# Patient Record
Sex: Female | Born: 1985 | Race: Black or African American | Hispanic: No | Marital: Single | Smoking: Former smoker
Health system: Southern US, Community
[De-identification: ages and names within clinical notes are randomized; demographics above are authoritative.]

## PROBLEM LIST (undated history)

## (undated) DIAGNOSIS — K219 Gastro-esophageal reflux disease without esophagitis: Secondary | ICD-10-CM

## (undated) DIAGNOSIS — N3000 Acute cystitis without hematuria: Secondary | ICD-10-CM

## (undated) DIAGNOSIS — J301 Allergic rhinitis due to pollen: Secondary | ICD-10-CM

## (undated) DIAGNOSIS — I10 Essential (primary) hypertension: Secondary | ICD-10-CM

## (undated) DIAGNOSIS — L0291 Cutaneous abscess, unspecified: Secondary | ICD-10-CM

## (undated) DIAGNOSIS — K829 Disease of gallbladder, unspecified: Secondary | ICD-10-CM

## (undated) DIAGNOSIS — E119 Type 2 diabetes mellitus without complications: Secondary | ICD-10-CM

---
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
## 2009-05-29 ENCOUNTER — Emergency Department (HOSPITAL_COMMUNITY): Admission: EM | Admit: 2009-05-29 | Discharge: 2009-05-29 | Payer: Self-pay | Admitting: Emergency Medicine

## 2009-11-02 ENCOUNTER — Inpatient Hospital Stay (HOSPITAL_COMMUNITY): Admission: AD | Admit: 2009-11-02 | Discharge: 2009-11-02 | Payer: Self-pay | Admitting: Family Medicine

## 2010-03-28 ENCOUNTER — Emergency Department (HOSPITAL_COMMUNITY): Admission: EM | Admit: 2010-03-28 | Discharge: 2010-03-28 | Payer: Self-pay | Admitting: Family Medicine

## 2010-11-29 ENCOUNTER — Emergency Department (HOSPITAL_COMMUNITY): Admission: EM | Admit: 2010-11-29 | Discharge: 2010-06-28 | Payer: Self-pay | Admitting: Emergency Medicine

## 2011-03-13 LAB — POCT RAPID STREP A (OFFICE): Streptococcus, Group A Screen (Direct): NEGATIVE

## 2011-03-27 LAB — URINE MICROSCOPIC-ADD ON

## 2011-03-27 LAB — URINALYSIS, ROUTINE W REFLEX MICROSCOPIC
Glucose, UA: NEGATIVE mg/dL
Nitrite: NEGATIVE
Specific Gravity, Urine: 1.015 (ref 1.005–1.030)
pH: 6 (ref 5.0–8.0)

## 2011-03-27 LAB — WET PREP, GENITAL

## 2011-03-27 LAB — POCT PREGNANCY, URINE: Preg Test, Ur: NEGATIVE

## 2011-08-21 ENCOUNTER — Emergency Department (HOSPITAL_COMMUNITY)
Admission: EM | Admit: 2011-08-21 | Discharge: 2011-08-22 | Disposition: A | Discharge status: Home / Self Care | Payer: Self-pay | Attending: Emergency Medicine | Admitting: Emergency Medicine

## 2011-08-21 DIAGNOSIS — R229 Localized swelling, mass and lump, unspecified: Secondary | ICD-10-CM | POA: Insufficient documentation

## 2011-08-21 DIAGNOSIS — R509 Fever, unspecified: Secondary | ICD-10-CM | POA: Insufficient documentation

## 2011-08-21 DIAGNOSIS — M79609 Pain in unspecified limb: Secondary | ICD-10-CM | POA: Insufficient documentation

## 2011-09-23 ENCOUNTER — Ambulatory Visit (INDEPENDENT_AMBULATORY_CARE_PROVIDER_SITE_OTHER): Payer: Self-pay

## 2011-09-23 ENCOUNTER — Inpatient Hospital Stay (INDEPENDENT_AMBULATORY_CARE_PROVIDER_SITE_OTHER)
Admission: RE | Admit: 2011-09-23 | Discharge: 2011-09-23 | Disposition: A | Discharge status: Home / Self Care | Payer: Self-pay | Source: Ambulatory Visit | Attending: Emergency Medicine | Admitting: Emergency Medicine

## 2011-09-23 DIAGNOSIS — K5289 Other specified noninfective gastroenteritis and colitis: Secondary | ICD-10-CM

## 2011-09-23 LAB — DIFFERENTIAL
Basophils Absolute: 0 10*3/uL (ref 0.0–0.1)
Eosinophils Absolute: 0.2 10*3/uL (ref 0.0–0.7)
Eosinophils Relative: 2 % (ref 0–5)
Lymphocytes Relative: 21 % (ref 12–46)
Lymphs Abs: 2.1 10*3/uL (ref 0.7–4.0)
Monocytes Absolute: 0.8 10*3/uL (ref 0.1–1.0)

## 2011-09-23 LAB — CBC
HCT: 39.4 % (ref 36.0–46.0)
MCHC: 33.5 g/dL (ref 30.0–36.0)
MCV: 90.2 fL (ref 78.0–100.0)
Platelets: 360 10*3/uL (ref 150–400)
RDW: 13.5 % (ref 11.5–15.5)
WBC: 10 10*3/uL (ref 4.0–10.5)

## 2011-09-23 LAB — POCT URINALYSIS DIP (DEVICE)
Ketones, ur: NEGATIVE mg/dL
Protein, ur: 30 mg/dL — AB
Urobilinogen, UA: 0.2 mg/dL (ref 0.0–1.0)
pH: 6.5 (ref 5.0–8.0)

## 2011-09-23 LAB — POCT PREGNANCY, URINE: Preg Test, Ur: NEGATIVE

## 2011-09-24 LAB — URINE CULTURE

## 2012-04-10 ENCOUNTER — Emergency Department (HOSPITAL_COMMUNITY): Payer: Self-pay

## 2012-04-10 ENCOUNTER — Encounter (HOSPITAL_COMMUNITY): Payer: Self-pay | Admitting: *Deleted

## 2012-04-10 ENCOUNTER — Emergency Department (HOSPITAL_COMMUNITY)
Admission: EM | Admit: 2012-04-10 | Discharge: 2012-04-10 | Disposition: A | Discharge status: Home / Self Care | Payer: Self-pay | Attending: Emergency Medicine | Admitting: Emergency Medicine

## 2012-04-10 DIAGNOSIS — R1013 Epigastric pain: Secondary | ICD-10-CM | POA: Insufficient documentation

## 2012-04-10 DIAGNOSIS — F17200 Nicotine dependence, unspecified, uncomplicated: Secondary | ICD-10-CM | POA: Insufficient documentation

## 2012-04-10 DIAGNOSIS — K8020 Calculus of gallbladder without cholecystitis without obstruction: Secondary | ICD-10-CM | POA: Insufficient documentation

## 2012-04-10 DIAGNOSIS — K8050 Calculus of bile duct without cholangitis or cholecystitis without obstruction: Secondary | ICD-10-CM

## 2012-04-10 DIAGNOSIS — R112 Nausea with vomiting, unspecified: Secondary | ICD-10-CM | POA: Insufficient documentation

## 2012-04-10 LAB — COMPREHENSIVE METABOLIC PANEL
AST: 48 U/L — ABNORMAL HIGH (ref 0–37)
BUN: 10 mg/dL (ref 6–23)
CO2: 24 mEq/L (ref 19–32)
Chloride: 103 mEq/L (ref 96–112)
Creatinine, Ser: 0.72 mg/dL (ref 0.50–1.10)
GFR calc Af Amer: >90 mL/min (ref >90)
GFR calc non Af Amer: >90 mL/min (ref >90)
Glucose, Bld: 104 mg/dL — ABNORMAL HIGH (ref 70–99)
Total Bilirubin: 0.3 mg/dL (ref 0.3–1.2)

## 2012-04-10 LAB — URINALYSIS, ROUTINE W REFLEX MICROSCOPIC
Glucose, UA: NEGATIVE mg/dL
Ketones, ur: NEGATIVE mg/dL
Protein, ur: NEGATIVE mg/dL
Urobilinogen, UA: 1 mg/dL (ref 0.0–1.0)

## 2012-04-10 LAB — DIFFERENTIAL
Basophils Absolute: 0 10*3/uL (ref 0.0–0.1)
Eosinophils Relative: 0 % (ref 0–5)
Lymphocytes Relative: 12 % (ref 12–46)
Lymphs Abs: 2.1 10*3/uL (ref 0.7–4.0)
Monocytes Absolute: 1.5 10*3/uL — ABNORMAL HIGH (ref 0.1–1.0)
Monocytes Relative: 8 % (ref 3–12)
Neutro Abs: 14.7 10*3/uL — ABNORMAL HIGH (ref 1.7–7.7)

## 2012-04-10 LAB — CBC
HCT: 40 % (ref 36.0–46.0)
Hemoglobin: 13.7 g/dL (ref 12.0–15.0)
MCV: 87.1 fL (ref 78.0–100.0)
RBC: 4.59 MIL/uL (ref 3.87–5.11)
WBC: 18.4 10*3/uL — ABNORMAL HIGH (ref 4.0–10.5)

## 2012-04-10 LAB — URINE MICROSCOPIC-ADD ON

## 2012-04-10 MED ORDER — ONDANSETRON HCL 4 MG PO TABS: 4.0000 mg | ORAL_TABLET | Freq: Three times a day (TID) | ORAL | Status: AC | PRN

## 2012-04-10 MED ORDER — HYDROCODONE-ACETAMINOPHEN 5-325 MG PO TABS: 1.0000 | ORAL_TABLET | ORAL | Status: AC | PRN

## 2012-04-10 MED ORDER — ONDANSETRON HCL 4 MG/2ML IJ SOLN
4.0000 mg | Freq: Once | INTRAMUSCULAR | Status: AC
  Administered 2012-04-10: 4 mg via INTRAVENOUS
  Filled 2012-04-10: qty 2

## 2012-04-10 MED ORDER — SODIUM CHLORIDE 0.9 % IV BOLUS (SEPSIS)
1000.0000 mL | Freq: Once | INTRAVENOUS | Status: AC
  Administered 2012-04-10: 1000 mL via INTRAVENOUS

## 2012-04-10 NOTE — ED Provider Notes (Signed)
History     CSN: [id number]  Arrival date & time 04/10/12  0020   First MD Initiated Contact with Patient 04/10/12 0232      Chief Complaint  Patient presents with  . Abdominal Pain    (Consider location/radiation/quality/duration/timing/severity/associated sxs/prior treatment) HPI Comments: Patient with several month history of episodic epigastric abdominal pain that worsens after meals.  She states that initially the pain was infrequent in nature but over the past week it has been after most every meal with nausea and vomiting for the past 2 days - she states tonight after eating dinner she reports severe pain with vomiting of NBNB vomitus.  She denies raidation of the pain, states that the pain is sharp and stabbing, has eased once she vomited.  She denies fever, chills, constipation, diarrhea, vaginal discharge or bleeding, dysuria, hematuria, previous abdominal surgeries.    Patient is a 26 y.o. female presenting with abdominal pain. The history is provided by the patient. No language interpreter was used.  Abdominal Pain The primary symptoms of the illness include abdominal pain, nausea and vomiting. The primary symptoms of the illness do not include fever, fatigue, shortness of breath, diarrhea, hematemesis, hematochezia, dysuria, vaginal discharge or vaginal bleeding. The current episode started more than 2 days ago. The onset of the illness was gradual. The problem has been gradually worsening.  The illness is associated with eating. The patient states that she believes she is currently not pregnant. The patient has not had a change in bowel habit. Symptoms associated with the illness do not include chills, anorexia, diaphoresis, heartburn, constipation, urgency, hematuria, frequency or back pain.    History reviewed. No pertinent past medical history.  History reviewed. No pertinent past surgical history.  History reviewed. No pertinent family history.  History  Substance Use  Topics  . Smoking status: Current Everyday Smoker -- 0.5 packs/day    Types: Cigarettes  . Smokeless tobacco: Not on file  . Alcohol Use: No    OB History    Grav Para Term Preterm Abortions TAB SAB Ect Mult Living                  Review of Systems  Constitutional: Negative for fever, chills, diaphoresis and fatigue.  Respiratory: Negative for shortness of breath.   Gastrointestinal: Positive for nausea, vomiting and abdominal pain. Negative for heartburn, diarrhea, constipation, hematochezia, anorexia and hematemesis.  Genitourinary: Negative for dysuria, urgency, frequency, hematuria, vaginal bleeding and vaginal discharge.  Musculoskeletal: Negative for back pain.  All other systems reviewed and are negative.    Allergies  Penicillins  Home Medications  No current outpatient prescriptions on file.  BP 118/70  Pulse 87  Temp(Src) 98.1 F (36.7 C) (Oral)  Resp 13  SpO2 97%  Physical Exam  Nursing note and vitals reviewed. Constitutional: She is oriented to person, place, and time. She appears well-developed and well-nourished. No distress.  HENT:  Head: Normocephalic and atraumatic.  Right Ear: External ear normal.  Left Ear: External ear normal.  Nose: Nose normal.  Mouth/Throat: Oropharynx is clear and moist. No oropharyngeal exudate.  Eyes: Conjunctivae are normal. Pupils are equal, round, and reactive to light. No scleral icterus.  Neck: Normal range of motion. Neck supple.  Cardiovascular: Normal rate, regular rhythm and normal heart sounds.  Exam reveals no gallop and no friction rub.   No murmur heard. Pulmonary/Chest: Effort normal and breath sounds normal. No respiratory distress. She has no wheezes. She has no rales. She exhibits no  tenderness.  Abdominal: Soft. Bowel sounds are normal. She exhibits no distension and no mass. There is tenderness. There is guarding. There is no rebound.    Musculoskeletal: Normal range of motion. She exhibits no edema  and no tenderness.  Lymphadenopathy:    She has no cervical adenopathy.  Neurological: She is alert and oriented to person, place, and time. No cranial nerve deficit.  Skin: Skin is warm and dry. No rash noted. No erythema. No pallor.  Psychiatric: She has a normal mood and affect. Her behavior is normal. Judgment and thought content normal.    ED Course  Procedures (including critical care time)  Labs Reviewed  CBC - Abnormal; Notable for the following:    WBC 18.4 (*)    Platelets 403 (*)    All other components within normal limits  DIFFERENTIAL - Abnormal; Notable for the following:    Neutrophils Relative 80 (*)    Neutro Abs 14.7 (*)    Monocytes Absolute 1.5 (*)    All other components within normal limits  COMPREHENSIVE METABOLIC PANEL - Abnormal; Notable for the following:    Glucose, Bld 104 (*)    AST 48 (*)    ALT 38 (*)    All other components within normal limits  URINALYSIS, ROUTINE W REFLEX MICROSCOPIC - Abnormal; Notable for the following:    APPearance CLOUDY (*)    Leukocytes, UA LARGE (*)    All other components within normal limits  URINE MICROSCOPIC-ADD ON - Abnormal; Notable for the following:    Squamous Epithelial / LPF FEW (*)    Bacteria, UA MANY (*)    All other components within normal limits  LIPASE, BLOOD  PREGNANCY, URINE   No results found. Results for orders placed during the hospital encounter of 04/10/12  CBC      Component Value Range   WBC 18.4 (*) 4.0 - 10.5 (K/uL)   RBC 4.59  3.87 - 5.11 (MIL/uL)   Hemoglobin 13.7  12.0 - 15.0 (g/dL)   HCT 16.1  09.6 - 04.5 (%)   MCV 87.1  78.0 - 100.0 (fL)   MCH 29.8  26.0 - 34.0 (pg)   MCHC 34.3  30.0 - 36.0 (g/dL)   RDW 40.9  81.1 - 91.4 (%)   Platelets 403 (*) 150 - 400 (K/uL)  DIFFERENTIAL      Component Value Range   Neutrophils Relative 80 (*) 43 - 77 (%)   Neutro Abs 14.7 (*) 1.7 - 7.7 (K/uL)   Lymphocytes Relative 12  12 - 46 (%)   Lymphs Abs 2.1  0.7 - 4.0 (K/uL)   Monocytes  Relative 8  3 - 12 (%)   Monocytes Absolute 1.5 (*) 0.1 - 1.0 (K/uL)   Eosinophils Relative 0  0 - 5 (%)   Eosinophils Absolute 0.1  0.0 - 0.7 (K/uL)   Basophils Relative 0  0 - 1 (%)   Basophils Absolute 0.0  0.0 - 0.1 (K/uL)  COMPREHENSIVE METABOLIC PANEL      Component Value Range   Sodium 138  135 - 145 (mEq/L)   Potassium 4.6  3.5 - 5.1 (mEq/L)   Chloride 103  96 - 112 (mEq/L)   CO2 24  19 - 32 (mEq/L)   Glucose, Bld 104 (*) 70 - 99 (mg/dL)   BUN 10  6 - 23 (mg/dL)   Creatinine, Ser 7.82  0.50 - 1.10 (mg/dL)   Calcium 95.6  8.4 - 10.5 (mg/dL)   Total Protein  7.9  6.0 - 8.3 (g/dL)   Albumin 4.4  3.5 - 5.2 (g/dL)   AST 48 (*) 0 - 37 (U/L)   ALT 38 (*) 0 - 35 (U/L)   Alkaline Phosphatase 90  39 - 117 (U/L)   Total Bilirubin 0.3  0.3 - 1.2 (mg/dL)   GFR calc non Af Amer >90  >90 (mL/min)   GFR calc Af Amer >90  >90 (mL/min)  LIPASE, BLOOD      Component Value Range   Lipase 31  11 - 59 (U/L)  URINALYSIS, ROUTINE W REFLEX MICROSCOPIC      Component Value Range   Color, Urine YELLOW  YELLOW    APPearance CLOUDY (*) CLEAR    Specific Gravity, Urine 1.024  1.005 - 1.030    pH 6.5  5.0 - 8.0    Glucose, UA NEGATIVE  NEGATIVE (mg/dL)   Hgb urine dipstick NEGATIVE  NEGATIVE    Bilirubin Urine NEGATIVE  NEGATIVE    Ketones, ur NEGATIVE  NEGATIVE (mg/dL)   Protein, ur NEGATIVE  NEGATIVE (mg/dL)   Urobilinogen, UA 1.0  0.0 - 1.0 (mg/dL)   Nitrite NEGATIVE  NEGATIVE    Leukocytes, UA LARGE (*) NEGATIVE   PREGNANCY, URINE      Component Value Range   Preg Test, Ur NEGATIVE  NEGATIVE   URINE MICROSCOPIC-ADD ON      Component Value Range   Squamous Epithelial / LPF FEW (*) RARE    WBC, UA 7-10  <3 (WBC/hpf)   Bacteria, UA MANY (*) RARE    US Abdomen Complete  04/10/2012  *RADIOLOGY REPORT*  Clinical Data:  Abdominal pain.  ABDOMINAL ULTRASOUND COMPLETE  Comparison:  Abdominal radiograph performed 09/23/2011  Findings:  Gallbladder:  Scattered mobile stones are seen layering  dependently within the gallbladder, measuring up to 0.7 cm in size.  No gallbladder wall thickening or pericholecystic fluid is seen. No ultrasonographic Murphy's sign is elicited.  Common Bile Duct:  6.6 mm in diameter; borderline prominent, without evidence of intrahepatic biliary ductal dilatation or other evidence for obstruction.  Liver:  Diffusely increased hepatic echogenicity and mildly coarsened echotexture likely reflect fatty infiltration; no focal lesions identified.  Limited Doppler evaluation demonstrates normal blood flow within the liver.  IVC:  Unremarkable in appearance.  Pancreas:  Although the pancreas is difficult to visualize in its entirety due to overlying bowel gas, no focal pancreatic abnormality is identified.  Spleen:  9.1 cm in length; within normal limits in size and echotexture.  Right kidney:  13.0 cm in length; normal in size, configuration and parenchymal echogenicity.  No evidence of mass or hydronephrosis.  Left kidney:  13.0 cm in length; normal in size, configuration and parenchymal echogenicity.  No evidence of mass or hydronephrosis.  Abdominal Aorta:  Normal in caliber; no aneurysm identified.  IMPRESSION:  1.  Borderline prominence of the common hepatic duct, measuring up to 6.6 mm in diameter, without definite evidence for obstruction. No intrahepatic biliary ductal dilatation seen. 2.  Cholelithiasis; gallbladder otherwise unremarkable in appearance. 3.  Diffuse fatty infiltration within the liver.  Original Report Authenticated By: Tonia Ghent, M.D.      Biliary Colic Cholelithiasis    MDM  Patient with symptoms consistent with biliary colic, she does have elevation in WBC, with mild elevation in transaminases without elevation in bili.  She clinically appears very comfortable and after discussion with Dr. Janee Morn with general surgery (after the patient was seen and examined by Dr. Ethelda Chick) we feel  that because the patient is comfortable she can follow up  with them on an outpatient basis.  Patient is aware of conditions and symptoms for which she should return and will call [facility] today for the next available appointment.        Tracy Weaver [place], [place] 04/10/12 [phone]

## 2012-04-10 NOTE — ED Provider Notes (Signed)
Medical screening examination/treatment/procedure(s) were conducted as a shared visit with non-physician practitioner(s) and myself.  I personally evaluated the patient during the encounter  Doug Sou, MD 04/10/12 [phone]

## 2012-04-10 NOTE — Discharge Instructions (Signed)
Biliary Colic  Biliary colic is a steady or irregular pain in the upper abdomen. It is usually under the right side of the rib cage. It happens when gallstones interfere with the normal flow of bile from the gallbladder. Bile is a liquid that helps to digest fats. Bile is made in the liver and stored in the gallbladder. When you eat a meal, bile passes from the gallbladder through the cystic duct and the common bile duct into the small intestine. There, it mixes with partially digested food. If a gallstone blocks either of these ducts, the normal flow of bile is blocked. The muscle cells in the bile duct contract forcefully to try to move the stone. This causes the pain of biliary colic.  SYMPTOMS   A person with biliary colic usually complains of pain in the upper abdomen. This pain can be:   In the center of the upper abdomen just below the breastbone.   In the upper-right part of the abdomen, near the gallbladder and liver.   Spread back toward the right shoulder blade.   Nausea and vomiting.   The pain usually occurs after eating.   Biliary colic is usually triggered by the digestive system's demand for bile. The demand for bile is high after fatty meals. Symptoms can also occur when a person who has been fasting suddenly eats a very large meal. Most episodes of biliary colic pass after 1 to 5 hours. After the most intense pain passes, your abdomen may continue to ache mildly for about 24 hours.  DIAGNOSIS  After you describe your symptoms, your caregiver will perform a physical exam. He or she will pay attention to the upper right portion of your belly (abdomen). This is the area of your liver and gallbladder. An ultrasound will help your caregiver look for gallstones. Specialized scans of the gallbladder may also be done. Blood tests may be done, especially if you have fever or if your pain persists. PREVENTION  Biliary colic can be prevented by controlling the risk factors for gallstones.  Some of these risk factors, such as heredity, increasing age, and pregnancy are a normal part of life. Obesity and a high-fat diet are risk factors you can change through a healthy lifestyle. Women going through menopause who take hormone replacement therapy (estrogen) are also more likely to develop biliary colic. TREATMENT   Pain medication may be prescribed.   You may be encouraged to eat a fat-free diet.   If the first episode of biliary colic is severe, or episodes of colic keep retuning, surgery to remove the gallbladder (cholecystectomy) is usually recommended. This procedure can be done through small incisions using an instrument called a laparoscope. The procedure often requires a brief stay in the hospital. Some people can leave the hospital the same day. It is the most widely used treatment in people troubled by painful gallstones. It is effective and safe, with no complications in more than 90% of cases.   If surgery cannot be done, medication that dissolves gallstones may be used. This medication is expensive and can take months or years to work. Only small stones will dissolve.   Rarely, medication to dissolve gallstones is combined with a procedure called shock-wave lithotripsy. This procedure uses carefully aimed shock waves to break up gallstones. In many people treated with this procedure, gallstones form again within a few years.  PROGNOSIS  If gallstones block your cystic duct or common bile duct, you are at risk for repeated episodes of   biliary colic. There is also a 25% chance that you will develop a gallbladder infection(acute cholecystitis), or some other complication of gallstones within 10 to 20 years. If you have surgery, schedule it at a time that is convenient for you and at a time when you are not sick. HOME CARE INSTRUCTIONS   Drink plenty of clear fluids.   Avoid fatty, greasy or fried foods, or any foods that make your pain worse.   Take medications as directed.    SEEK MEDICAL CARE IF:   You develop a fever over 100.5 F (38.1 C).   Your pain gets worse over time.   You develop nausea that prevents you from eating and drinking.   You develop vomiting.  SEEK IMMEDIATE MEDICAL CARE IF:   You have continuous or severe belly (abdominal) pain which is not relieved with medications.   You develop nausea and vomiting which is not relieved with medications.   You have symptoms of biliary colic and you suddenly develop a fever and shaking chills. This may signal cholecystitis. Call your caregiver immediately.   You develop a yellow color to your skin or the white part of your eyes (jaundice).  Document Released: 05/12/2006 Document Revised: 11/28/2011 Document Reviewed: 07/21/2008 [facility] Patient Information 2012 [place], [place].Gallstones Gallstones are a form of gallbladder disease. The gallbladder is a small organ that helps you digest food.  HOME CARE  Only take medicine as told by your doctor.   Eat a low-fat diet.   Follow up as told.  GET HELP RIGHT AWAY IF:   Your pain gets worse.   You develop yellow skin or eyes (jaundice).   The pain moves to another part of your belly (abdomen) or back.   You have a temperature by mouth above 102 F (38.9 C), not controlled by medicine.   You feel sick to your stomach (nauseous) and throw up (vomit).  MAKE SURE YOU:   Understand these instructions.   Will watch your condition.   Will get help right away if you are not doing well or get worse.  Document Released: 05/27/2008 Document Revised: 11/28/2011 Document Reviewed: 11/14/2009 [facility] Patient Information 2012 [place], [place].Gallbladder Disease Gallbladder disease (cholecystitis) is an inflammation of your gallbladder. It is usually caused by a build-up of stones (gallstones) or sludge (cholelithiasis) in your gallbladder. The gallbladder is not an essential organ. It is located slightly to the right of center in the belly  (abdomen), behind the liver. It stores bile made in the liver. Bile aids in digestion of fats. Gallbladder disease may result in nausea (feeling sick to your stomach), abdominal pain, and jaundice. In severe cases, emergency surgery may be required. The most common type of gallbladder disease is gallstones. They begin as small crystals and slowly grow into stones. Gallstone pain occurs when the bile duct has spasms. The spasms are caused by the stone passing out of the duct. The stone is trying to pass at the same time bile is passing into the small bowel for digestion. The pain usually begins suddenly. It may persist from several minutes to several hours. Infection can occur. Infection can add to discomfort and severity of an acute attack. The pain may be made worse by breathing deeply or by being jarred. There may be fever and tenderness to the touch. In some cases, when gallstones do not move into the bile duct, people have no pain or symptoms. These are called "silent" gallstones. Women are three times more likely to develop gallstones  than men. Women who have had several pregnancies are more likely to have gallbladder disease. Physicians sometimes advise removing diseased gallbladders before future pregnancies. Other factors that increase the risk of gallbladder disease are obesity, diets heavy in fried foods and dairy products, increasing age, prolonged use of medications containing female hormones, and heredity. HOME CARE INSTRUCTIONS   If your physician prescribed an antibiotic, take as directed.   Only take over-the-counter or prescription medicines for pain, discomfort, or fever as directed by your caregiver.   Follow a low fat diet until seen again. (Fat causes the gallbladder to contract.)   Follow-up as instructed. Attacks are almost always recurrent and surgery is usually required for permanent treatment.  SEEK IMMEDIATE MEDICAL CARE IF:   Pain is increasing and not controlled by  medications.   The pain moves to another part of your abdomen or to your back. (Right sided pain can be appendicitis and left sided pain in adults can be diverticulitis).   You have a fever.   You develop nausea and vomiting.  Document Released: 12/09/2005 Document Revised: 11/28/2011 Document Reviewed: 10/25/2011 [facility] Patient Information 2012 [place], [place].

## 2012-04-10 NOTE — ED Notes (Signed)
Patient is having mid abdominal pain that started after she ate dinner.  Patient has progressively gotten worse.  Intermittent pain that is sharp in nature.

## 2012-04-10 NOTE — ED Notes (Signed)
Pt complains of RUQ pain that is described as stabbing and constant. Pt states she has also had N/V with the pain.

## 2012-04-10 NOTE — ED Provider Notes (Signed)
Complains of epigastric pain and right upper quadrant pain onset 15 minutes after eating tonight. She vomited twice after the event. She is presently asymptomatic since treatment and antiemetics. Patient reports she's had similar episodes after eating for approximately one year On exam alert no distress nontoxic abdomen obese normal active bowel sounds nontender  Suspect biliary colic, discuused with Dr. Janee Morn Plan referral [facility] surgery   Doug Sou, MD 04/10/12 [phone]

## 2012-05-05 ENCOUNTER — Ambulatory Visit (INDEPENDENT_AMBULATORY_CARE_PROVIDER_SITE_OTHER): Payer: Self-pay | Admitting: Surgery

## 2012-05-15 ENCOUNTER — Encounter (INDEPENDENT_AMBULATORY_CARE_PROVIDER_SITE_OTHER): Payer: Self-pay | Admitting: Surgery

## 2013-04-09 ENCOUNTER — Encounter (HOSPITAL_COMMUNITY): Payer: Self-pay | Admitting: Emergency Medicine

## 2013-04-09 DIAGNOSIS — R109 Unspecified abdominal pain: Secondary | ICD-10-CM | POA: Insufficient documentation

## 2013-04-09 NOTE — ED Notes (Signed)
Pt st's she was dx with gallstones 1 yr ago but did not go to [facility].  Pt st's she started having pain in mid upper abd pain earlier today.  Pt denies nausea or vomiting.  St's she took her medication but has not helped

## 2013-04-10 ENCOUNTER — Emergency Department (HOSPITAL_COMMUNITY)
Admission: EM | Admit: 2013-04-10 | Discharge: 2013-04-10 | Disposition: A | Discharge status: Home / Self Care | Payer: Self-pay | Attending: Emergency Medicine | Admitting: Emergency Medicine

## 2013-04-10 ENCOUNTER — Emergency Department (HOSPITAL_COMMUNITY)
Admission: EM | Admit: 2013-04-10 | Discharge: 2013-04-10 | Discharge status: Against Medical Advice | Payer: Self-pay | Attending: Emergency Medicine | Admitting: Emergency Medicine

## 2013-04-10 ENCOUNTER — Emergency Department (HOSPITAL_COMMUNITY): Payer: Self-pay

## 2013-04-10 ENCOUNTER — Encounter (HOSPITAL_COMMUNITY): Payer: Self-pay | Admitting: Emergency Medicine

## 2013-04-10 DIAGNOSIS — K8020 Calculus of gallbladder without cholecystitis without obstruction: Secondary | ICD-10-CM

## 2013-04-10 DIAGNOSIS — R1011 Right upper quadrant pain: Secondary | ICD-10-CM

## 2013-04-10 DIAGNOSIS — R112 Nausea with vomiting, unspecified: Secondary | ICD-10-CM

## 2013-04-10 DIAGNOSIS — F17200 Nicotine dependence, unspecified, uncomplicated: Secondary | ICD-10-CM | POA: Insufficient documentation

## 2013-04-10 HISTORY — DX: Disease of gallbladder, unspecified: K82.9

## 2013-04-10 LAB — COMPREHENSIVE METABOLIC PANEL
AST: 250 U/L — ABNORMAL HIGH (ref 0–37)
Albumin: 4.4 g/dL (ref 3.5–5.2)
Alkaline Phosphatase: 121 U/L — ABNORMAL HIGH (ref 39–117)
BUN: 10 mg/dL (ref 6–23)
CO2: 26 mEq/L (ref 19–32)
Calcium: 10 mg/dL (ref 8.4–10.5)
Chloride: 99 mEq/L (ref 96–112)
Creatinine, Ser: 0.73 mg/dL (ref 0.50–1.10)
Creatinine, Ser: 0.74 mg/dL (ref 0.50–1.10)
GFR calc Af Amer: >90 mL/min (ref >90)
GFR calc non Af Amer: >90 mL/min (ref >90)
Glucose, Bld: 169 mg/dL — ABNORMAL HIGH (ref 70–99)
Potassium: 3.7 mEq/L (ref 3.5–5.1)
Sodium: 139 mEq/L (ref 135–145)
Total Bilirubin: 0.6 mg/dL (ref 0.3–1.2)
Total Protein: 7.9 g/dL (ref 6.0–8.3)
Total Protein: 8.4 g/dL — ABNORMAL HIGH (ref 6.0–8.3)

## 2013-04-10 LAB — CBC WITH DIFFERENTIAL/PLATELET
Basophils Absolute: 0 10*3/uL (ref 0.0–0.1)
Basophils Relative: 0 % (ref 0–1)
Eosinophils Absolute: 0.1 10*3/uL (ref 0.0–0.7)
Eosinophils Absolute: 0.1 10*3/uL (ref 0.0–0.7)
Eosinophils Relative: 1 % (ref 0–5)
HCT: 39.4 % (ref 36.0–46.0)
Lymphocytes Relative: 19 % (ref 12–46)
Lymphs Abs: 1.8 10*3/uL (ref 0.7–4.0)
MCH: 29.5 pg (ref 26.0–34.0)
MCHC: 34 g/dL (ref 30.0–36.0)
MCV: 86.6 fL (ref 78.0–100.0)
Monocytes Absolute: 0.8 10*3/uL (ref 0.1–1.0)
Monocytes Relative: 8 % (ref 3–12)
Neutro Abs: 9 10*3/uL — ABNORMAL HIGH (ref 1.7–7.7)
Neutrophils Relative %: 76 % (ref 43–77)
Platelets: 371 10*3/uL (ref 150–400)
RBC: 4.55 MIL/uL (ref 3.87–5.11)
RDW: 13.4 % (ref 11.5–15.5)

## 2013-04-10 LAB — URINALYSIS, ROUTINE W REFLEX MICROSCOPIC
Nitrite: NEGATIVE
Protein, ur: NEGATIVE mg/dL
Specific Gravity, Urine: 1.021 (ref 1.005–1.030)
Urobilinogen, UA: 1 mg/dL (ref 0.0–1.0)

## 2013-04-10 LAB — URINE MICROSCOPIC-ADD ON

## 2013-04-10 LAB — LIPASE, BLOOD: Lipase: 35 U/L (ref 11–59)

## 2013-04-10 MED ORDER — ONDANSETRON HCL 4 MG/2ML IJ SOLN
4.0000 mg | Freq: Once | INTRAMUSCULAR | Status: AC
  Administered 2013-04-10: 4 mg via INTRAVENOUS
  Filled 2013-04-10: qty 2

## 2013-04-10 MED ORDER — PROMETHAZINE HCL 25 MG PO TABS: 25.0000 mg | ORAL_TABLET | Freq: Four times a day (QID) | ORAL | Status: DC | PRN

## 2013-04-10 MED ORDER — SODIUM CHLORIDE 0.9 % IV BOLUS (SEPSIS)
1000.0000 mL | Freq: Once | INTRAVENOUS | Status: AC
  Administered 2013-04-10: 1000 mL via INTRAVENOUS

## 2013-04-10 MED ORDER — MORPHINE SULFATE 4 MG/ML IJ SOLN
4.0000 mg | Freq: Once | INTRAMUSCULAR | Status: AC
  Administered 2013-04-10: 4 mg via INTRAVENOUS
  Filled 2013-04-10: qty 1

## 2013-04-10 NOTE — ED Provider Notes (Signed)
History     CSN: [id number]  Arrival date & time 04/10/13  0136   First MD Initiated Contact with Patient 04/10/13 0258      Chief Complaint  Patient presents with  . Abdominal Pain    (Consider location/radiation/quality/duration/timing/severity/associated sxs/prior treatment) Patient is a 27 y.o. female presenting with abdominal pain. The history is provided by the patient and medical records. No language interpreter was used.  Abdominal Pain Pain location:  RUQ and epigastric Pain quality: bloating and heavy   Pain radiates to:  Does not radiate Pain severity:  Moderate Onset quality:  Gradual Duration:  5 hours Timing:  Constant Progression:  Waxing and waning Chronicity:  Recurrent Context: eating   Relieved by:  Nothing Worsened by:  Eating Ineffective treatments: narcotics. Associated symptoms: anorexia, belching, nausea and vomiting   Associated symptoms: no chest pain, no chills, no constipation, no cough, no diarrhea, no dysuria, no fatigue, no fever, no flatus, no hematemesis, no hematochezia, no hematuria, no melena, no shortness of breath, no sore throat, no vaginal bleeding and no vaginal discharge   Risk factors: obesity   Risk factors: no alcohol abuse, no aspirin use, not elderly, has not had multiple surgeries, no NSAID use, not pregnant and no recent hospitalization     Tracy Weaver is a 27 y.o. female  with a hx of gallstones presents to the Emergency Department complaining of gradual, persistent, progressively worsening RUQ pain onset tonight at 10pm. Pt states she has a known history of gallstones for which she never had surgery. She states she usually just takes her pain medication, but tonight the pain became unbearable. She has associated nausea and vomiting x1 have persistent right upper quadrant pain. Patient denies fever, chills, headache, neck pain, chest pain, shortness of breath, diarrhea, weakness, dizziness, syncope, dysuria, hematuria..   Nothing makes the symptoms better and eating makes the symptoms worse.    Past Medical History  Diagnosis Date  . Gallbladder disease     History reviewed. No pertinent past surgical history.  No family history on file.  History  Substance Use Topics  . Smoking status: Current Every Day Smoker -- 0.50 packs/day    Types: Cigarettes  . Smokeless tobacco: Not on file  . Alcohol Use: No    OB History   Grav Para Term Preterm Abortions TAB SAB Ect Mult Living                  Review of Systems  Constitutional: Positive for appetite change. Negative for fever, chills, diaphoresis, fatigue and unexpected weight change.  HENT: Negative for sore throat, mouth sores, trouble swallowing, neck pain and neck stiffness.   Respiratory: Negative for cough, chest tightness, shortness of breath, wheezing and stridor.   Cardiovascular: Negative for chest pain and palpitations.  Gastrointestinal: Positive for nausea, vomiting, abdominal pain and anorexia. Negative for diarrhea, constipation, blood in stool, melena, hematochezia, abdominal distention, rectal pain, flatus and hematemesis.  Genitourinary: Negative for dysuria, urgency, frequency, hematuria, flank pain, vaginal bleeding, vaginal discharge and difficulty urinating.  Musculoskeletal: Negative for back pain.  Skin: Negative for rash.  Neurological: Negative for weakness.  Hematological: Negative for adenopathy.  Psychiatric/Behavioral: Negative for confusion.  All other systems reviewed and are negative.    Allergies  Penicillins  Home Medications   Current Outpatient Rx  Name  Route  Sig  Dispense  Refill  . OXYCODONE HCL PO   Oral   Take 1 tablet by mouth daily as needed (pain).  BP 141/73  Pulse 104  Temp(Src) 98.7 F (37.1 C) (Oral)  Resp 18  Ht 5\' 7"  (1.702 m)  Wt 234 lb (106.142 kg)  BMI 36.64 kg/m2  SpO2 96%  LMP 04/08/2013  Physical Exam  Nursing note and vitals reviewed. Constitutional:  She is oriented to person, place, and time. She appears well-developed and well-nourished.  HENT:  Head: Normocephalic and atraumatic.  Right Ear: External ear normal.  Left Ear: External ear normal.  Nose: Nose normal.  Mouth/Throat: Oropharynx is clear and moist.  Eyes: Conjunctivae are normal. Pupils are equal, round, and reactive to light. No scleral icterus.  Neck: Normal range of motion.  Cardiovascular: Normal rate, regular rhythm, normal heart sounds and intact distal pulses.   No murmur heard. Pulmonary/Chest: Effort normal and breath sounds normal. No respiratory distress. She has no wheezes. She has no rales. She exhibits no tenderness.  Abdominal: Soft. Normal appearance and bowel sounds are normal. She exhibits no distension and no mass. There is tenderness in the right upper quadrant and epigastric area. There is guarding. There is no rigidity, no rebound, no CVA tenderness, no tenderness at McBurney's point and negative Murphy's sign.  Musculoskeletal: Normal range of motion. She exhibits no edema and no tenderness.  Lymphadenopathy:    She has no cervical adenopathy.  Neurological: She is alert and oriented to person, place, and time. She exhibits normal muscle tone. Coordination normal.  Skin: Skin is warm and dry. No rash noted. No erythema.  Psychiatric: She has a normal mood and affect. Her behavior is normal.    ED Course  Procedures (including critical care time)  Labs Reviewed  COMPREHENSIVE METABOLIC PANEL - Abnormal; Notable for the following:    Glucose, Bld 169 (*)    AST 320 (*)    ALT 235 (*)    Alkaline Phosphatase 144 (*)    All other components within normal limits  CBC WITH DIFFERENTIAL  LIPASE, BLOOD  URINALYSIS, ROUTINE W REFLEX MICROSCOPIC   US Abdomen Complete  04/10/2013  *RADIOLOGY REPORT*  Clinical Data:  Abdominal pain.  ABDOMINAL ULTRASOUND COMPLETE  Comparison:  Abdominal ultrasound performed 04/10/2012  Findings:  Gallbladder:  The  gallbladder is diffusely filled with stones, with a wall echo shadow sign.  No gallbladder wall thickening or pericholecystic fluid is seen.  No ultrasonographic Murphy's sign is elicited.  Common Bile Duct:  0.5 cm in diameter; within normal limits in caliber.  Liver:  Diffusely increased parenchymal echogenicity and coarsened echotexture, compatible with fatty infiltration; no focal lesions identified.  Limited Doppler evaluation demonstrates normal blood flow within the liver.  IVC:  Unremarkable in appearance.  Pancreas:  Although the pancreas is difficult to visualize in its entirety due to overlying bowel gas, no focal pancreatic abnormality is identified.  Spleen:  10.4 cm in length; within normal limits in size and echotexture.  Right kidney:  13.7 cm in length; normal in size, configuration and parenchymal echogenicity.  No evidence of mass or hydronephrosis.  Left kidney:  13.3 cm in length; normal in size, configuration and parenchymal echogenicity.  No evidence of mass or hydronephrosis.  Abdominal Aorta:  Normal in caliber; no aneurysm identified.  IMPRESSION:  1.  Gallbladder diffusely filled with stones, with a wall echo shadow sign.  No evidence for obstruction or cholecystitis.  No ultrasonographic Murphy's sign elicited. 2.  Diffuse fatty infiltration within the liver.   Original Report Authenticated By: Tonia Ghent, M.D.      1. Gallstones  2. RUQ abdominal pain   3. Nausea and vomiting in adult       MDM  Jeri Modena presents with epigastric fullness and RUQ pain.  Pt with Hx of cholelithiasis, but never had her gallbladder removed.    Pt with elevated liver function  And more concerning an increase in the delta liver function from several hours prior.  [place] with the gallbladder is diffusely filled with stones, with a wall echo shadow sign. No gallbladder wall thickening or pericholecystic fluid is seen no evidence for obstruction or cholecystitis.    Though no significant  findings on [place], liver function tests are concerning.  Will consult general surgery.    Discussed with Dr Magnus Ivan both the lab results and US findings.  Pt is to f/u with them in the office on Monday.    Dr. Donnetta Hutching was consulted, evaluated this patient with me and agrees with the plan.               Dahlia Client Careena Degraffenreid, PA-C 04/10/13 [phone]

## 2013-04-10 NOTE — ED Notes (Signed)
Patient is alert and oriented x3.  She was given DC instructions and follow up visit instructions.  Patient gave verbal understanding. She was DC ambulatory under her own power to home.  V/S stable.  He was not showing any signs of distress on DC 

## 2013-04-10 NOTE — ED Notes (Signed)
Pt states she is going to [facility] to be seen due to wait.  Encouraged pt to stay.  Pt wanted to know if [facility] would have access to test that were done at [facility].  Pt and family state they are going to [facility].

## 2013-04-10 NOTE — ED Notes (Signed)
Pt c/o epigastric fullness onset last night. Pt states she has known gallstones. Denies n/v/d.

## 2013-04-10 NOTE — ED Provider Notes (Signed)
Medical screening examination/treatment/procedure(s) were conducted as a shared visit with non-physician practitioner(s) and myself.  I personally evaluated the patient during the encounter.  Right upper quadrant pain c no acute abdomen. Liver functions are elevated. ultrasound confirms gallstones. Consultation with general surgery.  Patient will follow up on Monday.  Tracy Hutching, MD 04/10/13 [phone]

## 2013-04-23 ENCOUNTER — Ambulatory Visit (INDEPENDENT_AMBULATORY_CARE_PROVIDER_SITE_OTHER): Payer: Self-pay | Admitting: Surgery

## 2013-09-27 ENCOUNTER — Encounter (HOSPITAL_COMMUNITY): Payer: Self-pay | Admitting: Emergency Medicine

## 2013-09-27 ENCOUNTER — Emergency Department (INDEPENDENT_AMBULATORY_CARE_PROVIDER_SITE_OTHER)
Admission: EM | Admit: 2013-09-27 | Discharge: 2013-09-27 | Disposition: A | Discharge status: Home / Self Care | Payer: No Typology Code available for payment source | Source: Home / Self Care | Attending: Family Medicine | Admitting: Family Medicine

## 2013-09-27 DIAGNOSIS — K760 Fatty (change of) liver, not elsewhere classified: Secondary | ICD-10-CM

## 2013-09-27 DIAGNOSIS — K8020 Calculus of gallbladder without cholecystitis without obstruction: Secondary | ICD-10-CM

## 2013-09-27 DIAGNOSIS — K8021 Calculus of gallbladder without cholecystitis with obstruction: Secondary | ICD-10-CM | POA: Diagnosis present

## 2013-09-27 DIAGNOSIS — E669 Obesity, unspecified: Secondary | ICD-10-CM | POA: Diagnosis present

## 2013-09-27 DIAGNOSIS — N390 Urinary tract infection, site not specified: Secondary | ICD-10-CM

## 2013-09-27 DIAGNOSIS — K7689 Other specified diseases of liver: Secondary | ICD-10-CM

## 2013-09-27 HISTORY — DX: Essential (primary) hypertension: I10

## 2013-09-27 LAB — POCT URINALYSIS DIP (DEVICE)
Glucose, UA: NEGATIVE mg/dL
Nitrite: NEGATIVE
Specific Gravity, Urine: >=1.03 (ref 1.005–1.030)
Urobilinogen, UA: 0.2 mg/dL (ref 0.0–1.0)
pH: 5.5 (ref 5.0–8.0)

## 2013-09-27 MED ORDER — SULFAMETHOXAZOLE-TRIMETHOPRIM 800-160 MG PO TABS: 1.0000 | ORAL_TABLET | Freq: Two times a day (BID) | ORAL | Status: DC

## 2013-09-27 NOTE — ED Provider Notes (Signed)
Tracy Weaver is a 27 y.o. female who presents to Urgent Care today for mild abdominal pain. Patient has a history of long-standing gallstones that she has not been able to be managed because of lack of insurance. This provides chronic abdominal pain nausea and vomiting associated with some diarrhea. She noted some vomiting over the week which is normal for her. Additionally she notes some lower abdominal pressure and pain associated with some urinary urgency frequency and dysuria. This is unusual for her. She denies any fevers or chills. She is able to eat and drink normally currently. She has not yet seen a general surgeon if she does not have health insurance. She currently has the orange card.   Past Medical History  Diagnosis Date  . Gallbladder disease   . Hypertension    History  Substance Use Topics  . Smoking status: Current Every Day Smoker -- 0.50 packs/day    Types: Cigarettes  . Smokeless tobacco: Not on file  . Alcohol Use: No   ROS as above Medications reviewed. No current facility-administered medications for this encounter.   Current Outpatient Prescriptions  Medication Sig Dispense Refill  . OXYCODONE HCL PO Take 1 tablet by mouth daily as needed (pain).      . promethazine (PHENERGAN) 25 MG tablet Take 1 tablet (25 mg total) by mouth every 6 (six) hours as needed for nausea.  12 tablet  0  . sulfamethoxazole-trimethoprim (SEPTRA DS) 800-160 MG per tablet Take 1 tablet by mouth 2 (two) times daily.  14 tablet  0    Exam:  BP 146/107  Pulse 128  Temp(Src) 97.9 F (36.6 C) (Oral)  Resp 20  SpO2 100%  LMP 06/27/2013 Gen: Well NAD, obese HEENT: EOMI,  MMM Lungs: CTABL Nl WOB Heart: RRR no MRG Abd: NABS, NT, ND, negative Murphy sign. No CV tenderness to percussion bilaterally Exts: Non edematous BL  LE, warm and well perfused.   Results for orders placed during the hospital encounter of 09/27/13 (from the past 24 hour(s))  POCT URINALYSIS DIP (DEVICE)      Status: Abnormal   Collection Time    09/27/13 11:24 AM      Result Value Range   Glucose, UA NEGATIVE  NEGATIVE mg/dL   Bilirubin Urine NEGATIVE  NEGATIVE   Ketones, ur NEGATIVE  NEGATIVE mg/dL   Specific Gravity, Urine >=1.030  1.005 - 1.030   Hgb urine dipstick TRACE (*) NEGATIVE   pH 5.5  5.0 - 8.0   Protein, ur 30 (*) NEGATIVE mg/dL   Urobilinogen, UA 0.2  0.0 - 1.0 mg/dL   Nitrite NEGATIVE  NEGATIVE   Leukocytes, UA TRACE (*) NEGATIVE  POCT PREGNANCY, URINE     Status: None   Collection Time    09/27/13 11:31 AM      Result Value Range   Preg Test, Ur NEGATIVE  NEGATIVE   No results found.  Assessment and Plan: 27 y.o. female with  1) gallstones: Refer to general surgery for cholecystectomy evaluation 2) probable urinary tract infection. Urine culture pending plan to treat empirically with Septra as patient is allergic to penicillin Followup if not improving Discussed warning signs or symptoms. Please see discharge instructions. Patient expresses understanding.      Rodolph Bong, MD 09/27/13 1309

## 2013-09-27 NOTE — ED Notes (Signed)
C/o gall bladder problems. Unable to keep anything down last week and gradually getting worse. Hot/cold chills.  Recent ER visit in April for same problem.  Frequent urination.  No otc meds used for symptoms.

## 2013-09-27 NOTE — ED Notes (Signed)
Pt also states that she has not had a cycle in 2 months. Mw,cma

## 2013-09-28 LAB — URINE CULTURE: Colony Count: 70000

## 2013-09-28 NOTE — ED Notes (Signed)
Pt came into office stating that [facility] denied service to her b/c she had the [employer] and was adv by Archie Patten that they can see the pt w/a $225 up front fee... They have met their quota for the orange card this month Notified pt that they are not denying her any service but she would need to come up w/$225 Also adv pt to est care at [facility] w/PCP and have them refer her to another general surgeon Adv pt that if her sxs get worse, she is to go to the ER... Pt verbalized understanding.

## 2013-12-05 ENCOUNTER — Encounter (HOSPITAL_COMMUNITY): Payer: Self-pay | Admitting: Emergency Medicine

## 2013-12-05 ENCOUNTER — Emergency Department (HOSPITAL_COMMUNITY)
Admission: EM | Admit: 2013-12-05 | Discharge: 2013-12-06 | Disposition: A | Discharge status: Home / Self Care | Payer: Self-pay | Attending: Emergency Medicine | Admitting: Emergency Medicine

## 2013-12-05 DIAGNOSIS — R630 Anorexia: Secondary | ICD-10-CM | POA: Insufficient documentation

## 2013-12-05 DIAGNOSIS — R1110 Vomiting, unspecified: Secondary | ICD-10-CM | POA: Insufficient documentation

## 2013-12-05 DIAGNOSIS — I10 Essential (primary) hypertension: Secondary | ICD-10-CM | POA: Insufficient documentation

## 2013-12-05 DIAGNOSIS — Z880 Allergy status to penicillin: Secondary | ICD-10-CM | POA: Insufficient documentation

## 2013-12-05 DIAGNOSIS — R6883 Chills (without fever): Secondary | ICD-10-CM | POA: Insufficient documentation

## 2013-12-05 DIAGNOSIS — F17200 Nicotine dependence, unspecified, uncomplicated: Secondary | ICD-10-CM | POA: Insufficient documentation

## 2013-12-05 DIAGNOSIS — Z3202 Encounter for pregnancy test, result negative: Secondary | ICD-10-CM | POA: Insufficient documentation

## 2013-12-05 DIAGNOSIS — K8020 Calculus of gallbladder without cholecystitis without obstruction: Secondary | ICD-10-CM | POA: Insufficient documentation

## 2013-12-05 DIAGNOSIS — K5900 Constipation, unspecified: Secondary | ICD-10-CM | POA: Insufficient documentation

## 2013-12-05 NOTE — ED Notes (Signed)
Pt arrived to the ED with a complaint of abdominal pain.  Pt has a hx of gallstones.  Pt has been sent to the surgeon but she is unable to afford the surgery.  Pain is located upper abdomen medially.

## 2013-12-06 ENCOUNTER — Emergency Department (HOSPITAL_COMMUNITY): Payer: Self-pay

## 2013-12-06 LAB — LIPASE, BLOOD: Lipase: 28 U/L (ref 11–59)

## 2013-12-06 LAB — URINALYSIS, ROUTINE W REFLEX MICROSCOPIC
Bilirubin Urine: NEGATIVE
Hgb urine dipstick: NEGATIVE
Specific Gravity, Urine: 1.031 — ABNORMAL HIGH (ref 1.005–1.030)
Urobilinogen, UA: 0.2 mg/dL (ref 0.0–1.0)
pH: 6 (ref 5.0–8.0)

## 2013-12-06 LAB — CBC WITH DIFFERENTIAL/PLATELET
Basophils Absolute: 0 10*3/uL (ref 0.0–0.1)
Basophils Relative: 0 % (ref 0–1)
Eosinophils Absolute: 0.1 10*3/uL (ref 0.0–0.7)
MCH: 29.8 pg (ref 26.0–34.0)
MCHC: 34.4 g/dL (ref 30.0–36.0)
Neutro Abs: 7.3 10*3/uL (ref 1.7–7.7)
Neutrophils Relative %: 63 % (ref 43–77)
Platelets: 293 10*3/uL (ref 150–400)
RBC: 4.47 MIL/uL (ref 3.87–5.11)

## 2013-12-06 LAB — COMPREHENSIVE METABOLIC PANEL
ALT: 33 U/L (ref 0–35)
AST: 25 U/L (ref 0–37)
Albumin: 4.2 g/dL (ref 3.5–5.2)
Alkaline Phosphatase: 98 U/L (ref 39–117)
Chloride: 95 mEq/L — ABNORMAL LOW (ref 96–112)
Potassium: 3.7 mEq/L (ref 3.5–5.1)
Sodium: 133 mEq/L — ABNORMAL LOW (ref 135–145)
Total Bilirubin: 0.2 mg/dL — ABNORMAL LOW (ref 0.3–1.2)
Total Protein: 7.7 g/dL (ref 6.0–8.3)

## 2013-12-06 LAB — URINE MICROSCOPIC-ADD ON

## 2013-12-06 MED ORDER — OXYCODONE-ACETAMINOPHEN 5-325 MG PO TABS: 1.0000 | ORAL_TABLET | Freq: Four times a day (QID) | ORAL | Status: DC | PRN

## 2013-12-06 MED ORDER — MORPHINE SULFATE 4 MG/ML IJ SOLN
4.0000 mg | Freq: Once | INTRAMUSCULAR | Status: AC
  Administered 2013-12-06: 4 mg via INTRAVENOUS
  Filled 2013-12-06: qty 1

## 2013-12-06 MED ORDER — ONDANSETRON HCL 4 MG/2ML IJ SOLN
4.0000 mg | Freq: Once | INTRAMUSCULAR | Status: AC
  Administered 2013-12-06: 4 mg via INTRAVENOUS
  Filled 2013-12-06: qty 2

## 2013-12-06 NOTE — ED Provider Notes (Addendum)
CSN: [id number]     Arrival date & time 12/05/13  2256 History   First MD Initiated Contact with Patient 12/06/13 0021     Chief Complaint  Patient presents with  . Abdominal Pain   (Consider location/radiation/quality/duration/timing/severity/associated sxs/prior Treatment) Patient is a 27 y.o. female presenting with abdominal pain. The history is provided by the patient.  Abdominal Pain Pain location:  RUQ Pain quality: aching and throbbing   Pain radiates to:  Does not radiate Pain severity:  Moderate Onset quality:  Gradual Duration: has been going on intermittently for a year but worse in the last 2-3 days. Timing:  Intermittent Progression:  Worsening Chronicity:  Recurrent Context: eating   Relieved by: has been taking her mom's pain meds. Worsened by:  Eating Ineffective treatments:  Acetaminophen Associated symptoms: anorexia, chills, constipation and nausea   Associated symptoms: no cough, no diarrhea, no fever, no shortness of breath, no vaginal bleeding, no vaginal discharge and no vomiting   Risk factors comment:  Hx of gallstones   Past Medical History  Diagnosis Date  . Gallbladder disease   . Hypertension    History reviewed. No pertinent past surgical history. History reviewed. No pertinent family history. History  Substance Use Topics  . Smoking status: Current Every Day Smoker -- 0.50 packs/day    Types: Cigarettes  . Smokeless tobacco: Not on file  . Alcohol Use: No   OB History   Grav Para Term Preterm Abortions TAB SAB Ect Mult Living                 Review of Systems  Constitutional: Positive for chills. Negative for fever.  Respiratory: Negative for cough and shortness of breath.   Gastrointestinal: Positive for nausea, abdominal pain, constipation and anorexia. Negative for vomiting and diarrhea.  Genitourinary: Negative for vaginal bleeding and vaginal discharge.  All other systems reviewed and are negative.    Allergies   Penicillins  Home Medications  No current outpatient prescriptions on file. BP 143/100  Pulse 118  Temp(Src) 98.3 F (36.8 C) (Oral)  Resp 18  Ht 5\' 8"  (1.727 m)  Wt 234 lb 4.8 oz (106.278 kg)  BMI 35.63 kg/m2  SpO2 98%  LMP 08/23/2013 Physical Exam  Nursing note and vitals reviewed. Constitutional: She is oriented to person, place, and time. She appears well-developed and well-nourished. No distress.  HENT:  Head: Normocephalic and atraumatic.  Mouth/Throat: Oropharynx is clear and moist.  Eyes: Conjunctivae and EOM are normal. Pupils are equal, round, and reactive to light.  Neck: Normal range of motion. Neck supple.  Cardiovascular: Normal rate, regular rhythm and intact distal pulses.   No murmur heard. Pulmonary/Chest: Effort normal and breath sounds normal. No respiratory distress. She has no wheezes. She has no rales.  Abdominal: Soft. She exhibits no distension. There is tenderness in the right upper quadrant. There is no rebound, no guarding, no CVA tenderness, no tenderness at McBurney's point and negative Murphy's sign.  Musculoskeletal: Normal range of motion. She exhibits no edema and no tenderness.  Neurological: She is alert and oriented to person, place, and time.  Skin: Skin is warm and dry. No rash noted. No erythema.  Psychiatric: She has a normal mood and affect. Her behavior is normal.    ED Course  Procedures (including critical care time) Labs Review Labs Reviewed  CBC WITH DIFFERENTIAL - Abnormal; Notable for the following:    WBC 11.6 (*)    Monocytes Absolute 1.1 (*)    All  other components within normal limits  COMPREHENSIVE METABOLIC PANEL - Abnormal; Notable for the following:    Sodium 133 (*)    Chloride 95 (*)    Glucose, Bld 261 (*)    Total Bilirubin 0.2 (*)    All other components within normal limits  LIPASE, BLOOD  URINALYSIS, ROUTINE W REFLEX MICROSCOPIC  POCT PREGNANCY, URINE   Imaging Review US Abdomen Complete  12/06/2013    CLINICAL DATA:  Evaluate gallbladder  EXAM: ULTRASOUND ABDOMEN COMPLETE  COMPARISON:  04/10/2013  FINDINGS: Gallbladder:  Numerous gallstones, creating a wall echo shadow sign. No sonographic Murphy sign per sonographer exam (verbal). Limited evaluation of the wall due to extensive shadowing.  Common bile duct:  Diameter: 6 mm diameter, similar to prior.  Liver:  Echogenic with poor acoustic transmission.  No focal abnormality.  IVC:  No abnormality visualized.  Pancreas:  Limited visualization.  No evidence of focal abnormality.  Spleen:  Size and appearance within normal limits.  Right Kidney:  Length: 12 cm. Echogenicity within normal limits. No mass or hydronephrosis visualized.  Left Kidney:  Length: 12 cm. Echogenicity within normal limits. No mass or hydronephrosis visualized.  Abdominal aorta:  No aneurysm visualized.  Other findings:  None.  IMPRESSION: 1. Cholelithiasis, filling the gallbladder. No evidence of acute cholecystitis. 2. Hepatic steatosis.   Electronically Signed   By: Tiburcio Pea M.D.   On: 12/06/2013 01:41    EKG Interpretation   None       MDM   1. Cholelithiasis   2. Constipation     Patient with history of cholelithiasis which she states the pain is getting permanently worse. She takes her mom's pain medication when the pain gets very bad that she is still waiting to see a surgeon because she cannot pay a large upfront cost so still waiting. Patient has right upper quadrant pain on exam no Murphy's sign.  Also she is complaining of constipation which is most likely coming from taking the pain medication. Patient is tachycardic here but otherwise stable vital signs. Today on ultrasound shows numerous gallstones but no signs of cholecystitis. Only mild leukocytosis and otherwise liver and and lipase are within normal limits.   urine is negative for infection and UPT is negative. Patient feeling better after pain medication. Continue to stress to her the importance of  seeing a surgeon for surgery and patient will continue to try to get surgery with the orange card through her physician.  Gwyneth Sprout, MD 12/06/13 [id number]  Gwyneth Sprout, MD 12/06/13 1191

## 2013-12-07 LAB — URINE CULTURE: Colony Count: 60000

## 2014-09-02 ENCOUNTER — Emergency Department (HOSPITAL_COMMUNITY)
Admission: EM | Admit: 2014-09-02 | Discharge: 2014-09-02 | Disposition: A | Discharge status: Home / Self Care | Payer: Self-pay | Attending: Emergency Medicine | Admitting: Emergency Medicine

## 2014-09-02 ENCOUNTER — Encounter (HOSPITAL_COMMUNITY): Payer: Self-pay | Admitting: Emergency Medicine

## 2014-09-02 DIAGNOSIS — Z8719 Personal history of other diseases of the digestive system: Secondary | ICD-10-CM | POA: Insufficient documentation

## 2014-09-02 DIAGNOSIS — F17200 Nicotine dependence, unspecified, uncomplicated: Secondary | ICD-10-CM | POA: Insufficient documentation

## 2014-09-02 DIAGNOSIS — S61509A Unspecified open wound of unspecified wrist, initial encounter: Secondary | ICD-10-CM | POA: Insufficient documentation

## 2014-09-02 DIAGNOSIS — Z880 Allergy status to penicillin: Secondary | ICD-10-CM | POA: Insufficient documentation

## 2014-09-02 DIAGNOSIS — IMO0002 Reserved for concepts with insufficient information to code with codable children: Secondary | ICD-10-CM | POA: Insufficient documentation

## 2014-09-02 DIAGNOSIS — I10 Essential (primary) hypertension: Secondary | ICD-10-CM | POA: Insufficient documentation

## 2014-09-02 DIAGNOSIS — Z23 Encounter for immunization: Secondary | ICD-10-CM | POA: Insufficient documentation

## 2014-09-02 DIAGNOSIS — S60811A Abrasion of right wrist, initial encounter: Secondary | ICD-10-CM

## 2014-09-02 MED ORDER — TETANUS-DIPHTH-ACELL PERTUSSIS 5-2.5-18.5 LF-MCG/0.5 IM SUSP
0.5000 mL | Freq: Once | INTRAMUSCULAR | Status: AC
  Administered 2014-09-02: 0.5 mL via INTRAMUSCULAR
  Filled 2014-09-02: qty 0.5

## 2014-09-02 NOTE — Discharge Instructions (Signed)
Read the information below.  You may return to the Emergency Department at any time for worsening condition or any new symptoms that concern you.  If you develop redness, swelling, pus draining from the wound, or fevers greater than 100.4, return to the ER immediately for a recheck.     Abrasions An abrasion is a cut or scrape of the skin. Abrasions do not go through all layers of the skin. HOME CARE  If a bandage (dressing) was put on your wound, change it as told by your doctor. If the bandage sticks, soak it off with warm.  Wash the area with water and soap 2 times a day. Rinse off the soap. Pat the area dry with a clean towel.  Put on medicated cream (ointment) as told by your doctor.  Change your bandage right away if it gets wet or dirty.  Only take medicine as told by your doctor.  See your doctor within 24-48 hours to get your wound checked.  Check your wound for redness, puffiness (swelling), or yellowish-white fluid (pus). GET HELP RIGHT AWAY IF:   You have more pain in the wound.  You have redness, swelling, or tenderness around the wound.  You have pus coming from the wound.  You have a fever or lasting symptoms for more than 2-3 days.  You have a fever and your symptoms suddenly get worse.  You have a bad smell coming from the wound or bandage. MAKE SURE YOU:   Understand these instructions.  Will watch your condition.  Will get help right away if you are not doing well or get worse. Document Released: 05/27/2008 Document Revised: 09/02/2012 Document Reviewed: 11/12/2011 [facility] Patient Information 2015 [place], [place]. This information is not intended to replace advice given to you by your health care provider. Make sure you discuss any questions you have with your health care provider.
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
## 2014-09-03 NOTE — ED Provider Notes (Signed)
Medical screening examination/treatment/procedure(s) were performed by non-physician practitioner and as supervising physician I was immediately available for consultation/collaboration.   Atara Paterson L Caniya Tagle, MD 09/03/14 1044 

## 2014-09-07 IMAGING — US US ABDOMEN COMPLETE
1 series · 14 of 25 positions shown · non-contrast
Comparison: 04/10/2013

CLINICAL DATA: Evaluate gallbladder

EXAM:
ULTRASOUND ABDOMEN COMPLETE

[Series 1: us abdomen complete · 0.24mm/px · 14 of 67 slices shown]
[im 1/67]
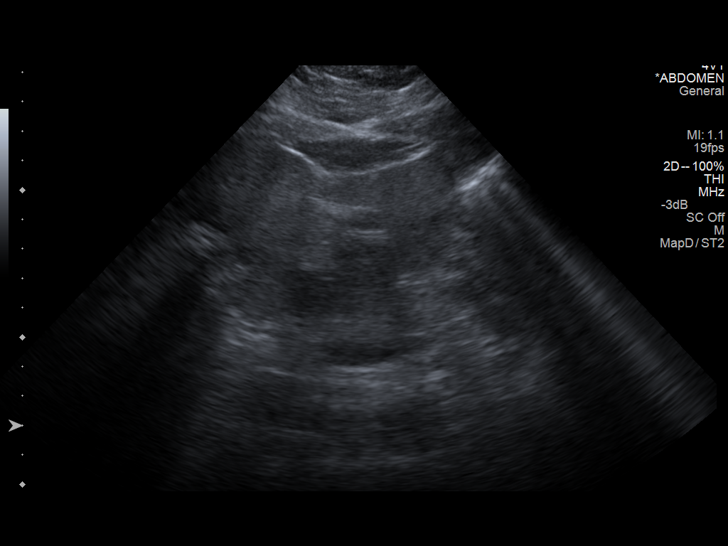
[im 6/67]
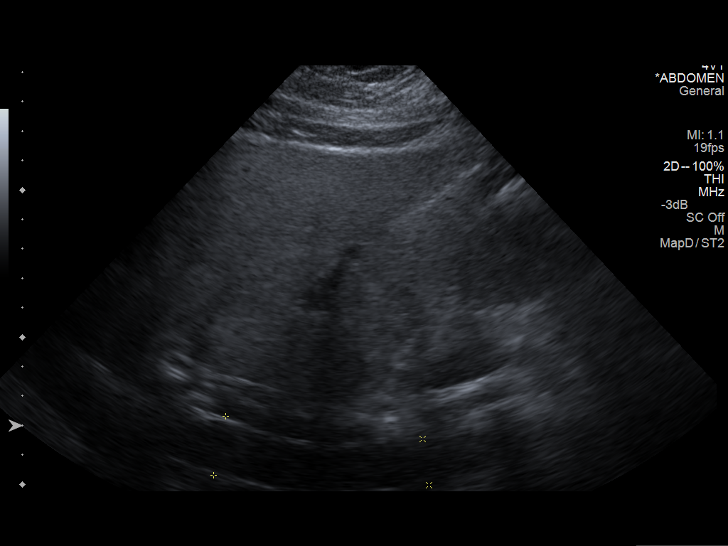
[im 12/67]
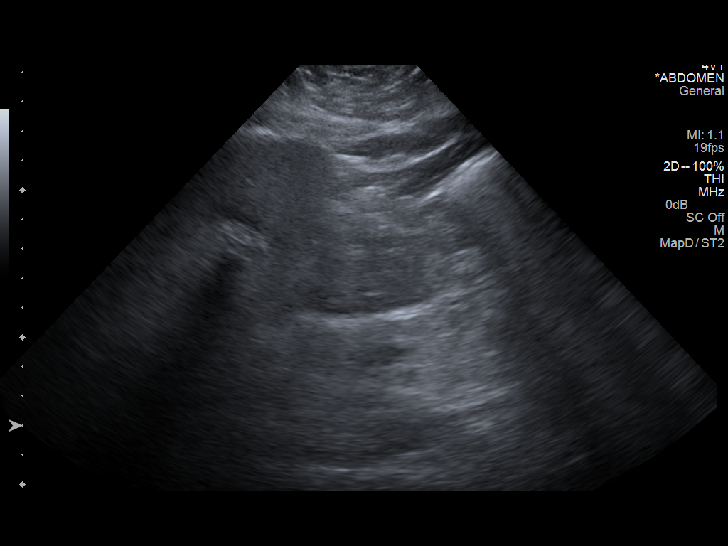
[im 17/67]
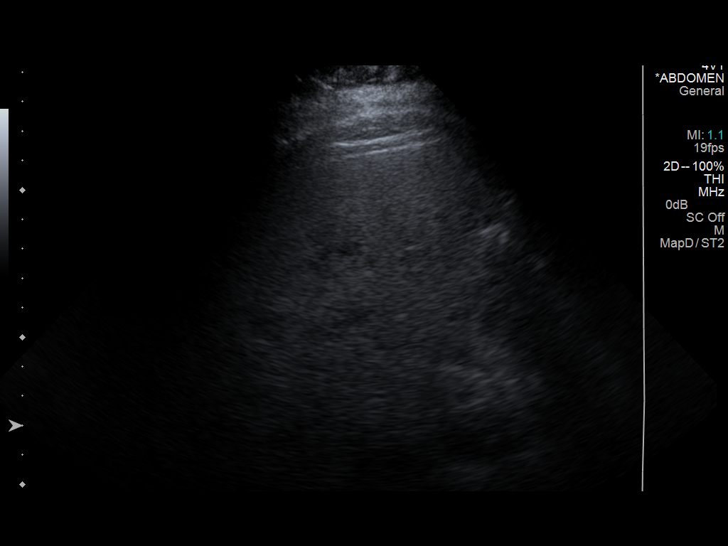
[im 23/67]
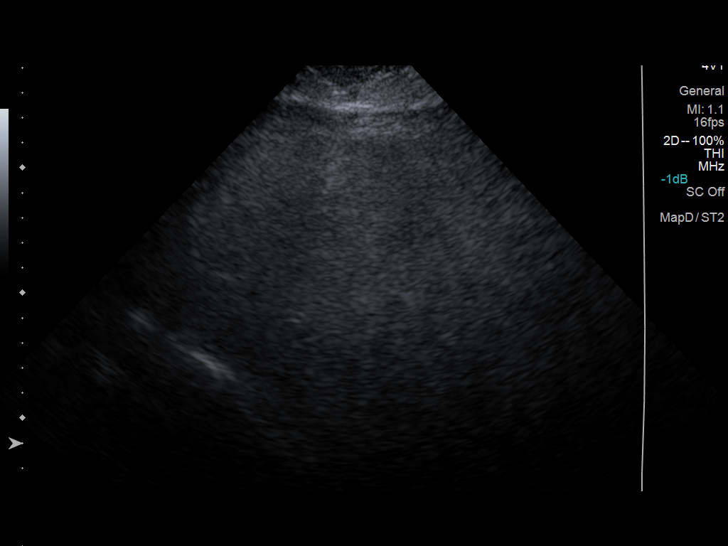
[im 25/67]
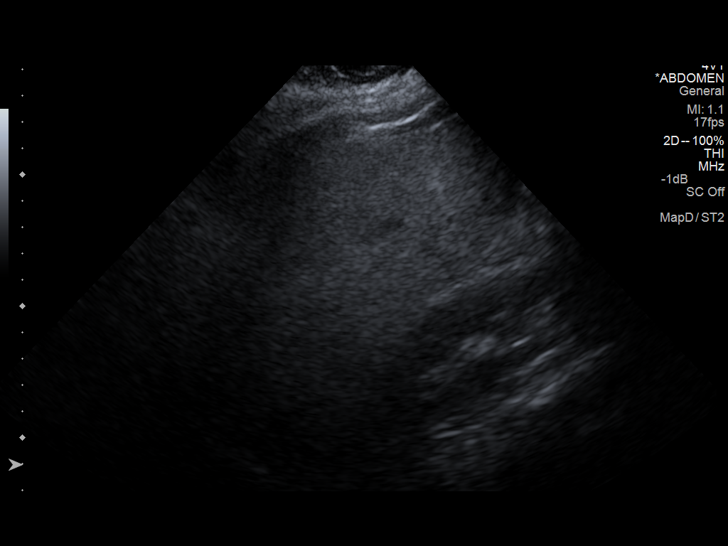
[im 31/67]
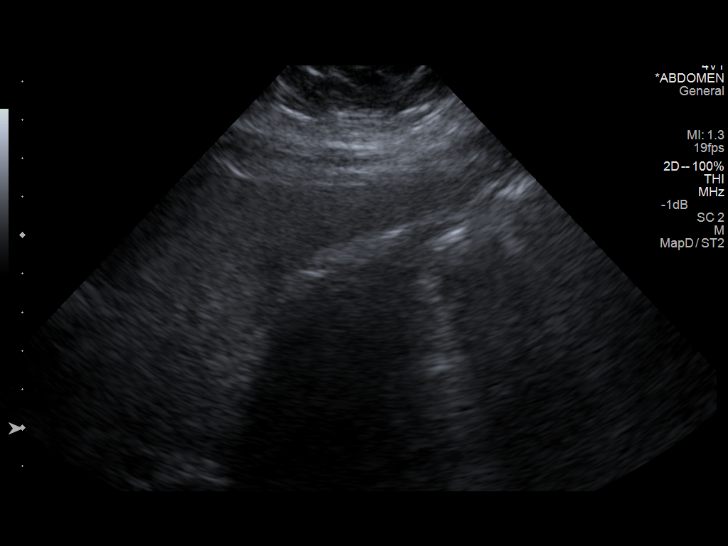
[im 36/67]
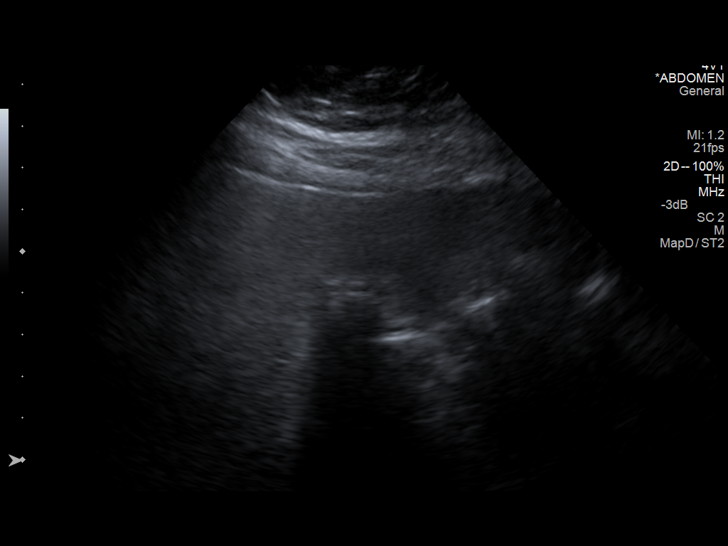
[im 42/67]
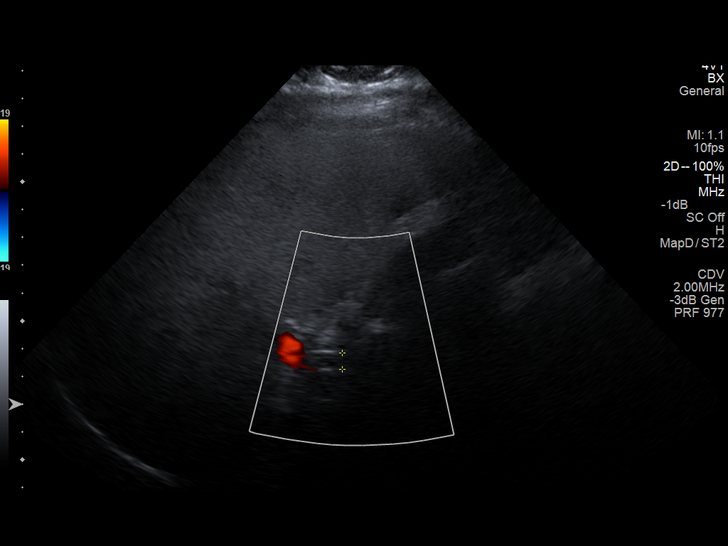
[im 45/67]
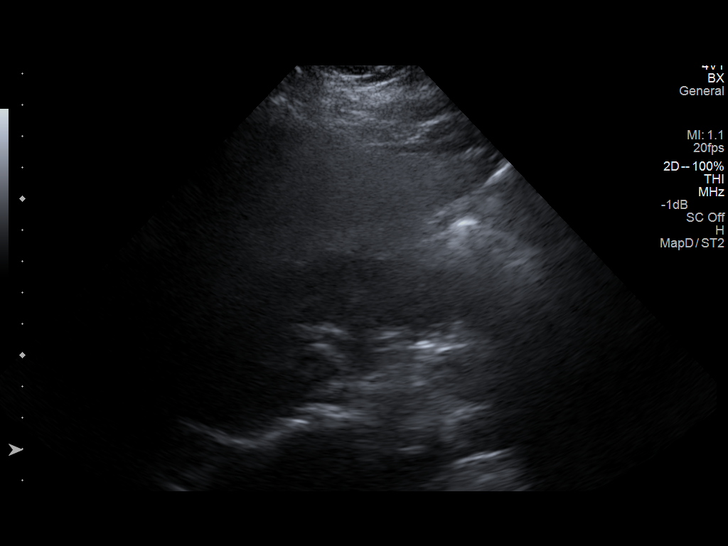
[im 50/67]
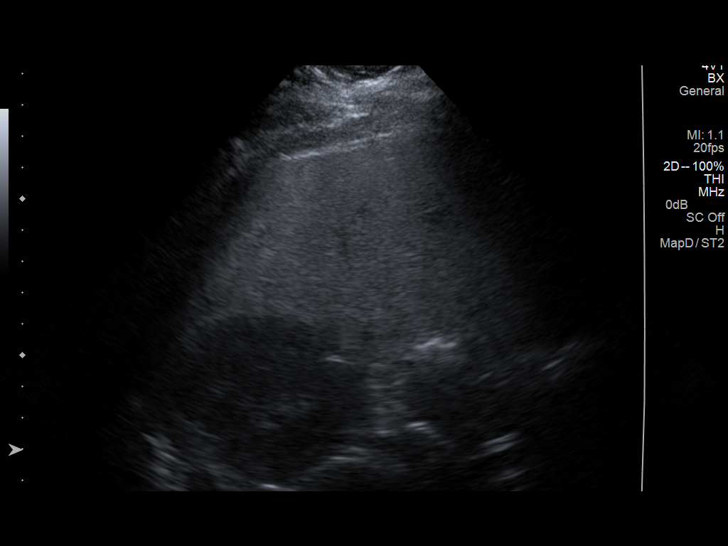
[im 56/67]
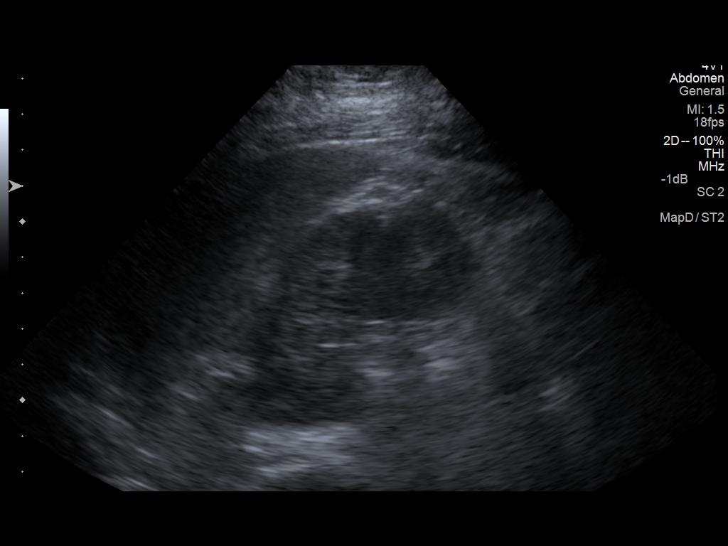
[im 61/67]
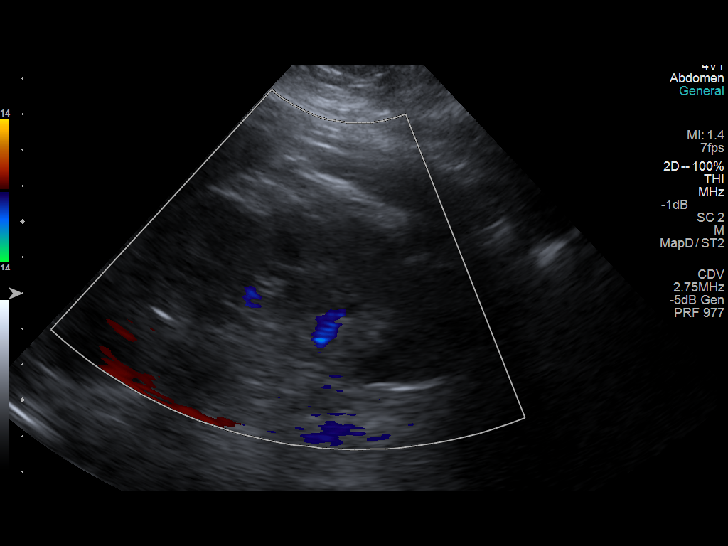
[im 67/67]
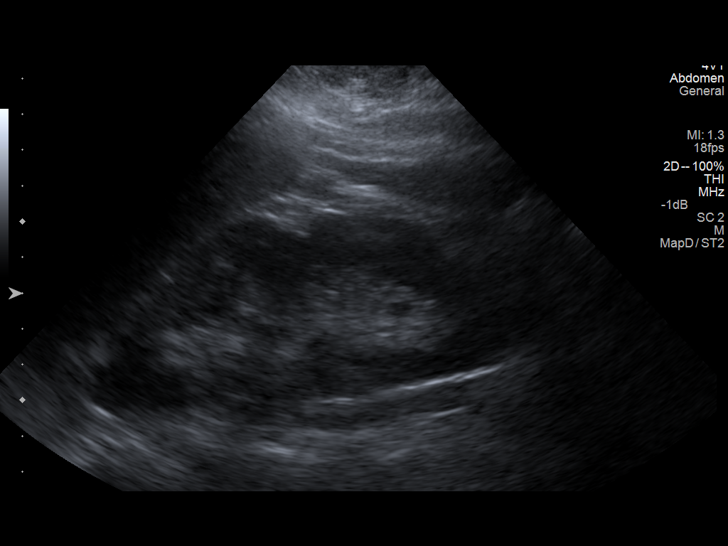

[14 of 25 positions shown; findings below may reference images not displayed]

FINDINGS: Gallbladder:

Numerous gallstones, creating a wall echo shadow sign. No
sonographic Murphy sign per sonographer exam (verbal). Limited
evaluation of the wall due to extensive shadowing.

Common bile duct:

Diameter: 6 mm diameter, similar to prior.

Liver:

Echogenic with poor acoustic transmission.  No focal abnormality.

IVC:

No abnormality visualized.

Pancreas:

Limited visualization.  No evidence of focal abnormality.

Spleen:

Size and appearance within normal limits.

Right Kidney:

Length: 12 cm. Echogenicity within normal limits. No mass or
hydronephrosis visualized.

Left Kidney:

Length: 12 cm. Echogenicity within normal limits. No mass or
hydronephrosis visualized.

Abdominal aorta:

No aneurysm visualized.

Other findings:

None.
IMPRESSION: 1. Cholelithiasis, filling the gallbladder. No evidence of acute
cholecystitis.
2. Hepatic steatosis.

## 2014-11-13 ENCOUNTER — Emergency Department (HOSPITAL_COMMUNITY)
Admission: EM | Admit: 2014-11-13 | Discharge: 2014-11-13 | Disposition: A | Discharge status: Home / Self Care | Payer: No Typology Code available for payment source | Attending: Emergency Medicine | Admitting: Emergency Medicine

## 2014-11-13 ENCOUNTER — Encounter (HOSPITAL_COMMUNITY): Payer: Self-pay | Admitting: Emergency Medicine

## 2014-11-13 DIAGNOSIS — Z880 Allergy status to penicillin: Secondary | ICD-10-CM | POA: Insufficient documentation

## 2014-11-13 DIAGNOSIS — I10 Essential (primary) hypertension: Secondary | ICD-10-CM | POA: Diagnosis not present

## 2014-11-13 DIAGNOSIS — Y998 Other external cause status: Secondary | ICD-10-CM | POA: Insufficient documentation

## 2014-11-13 DIAGNOSIS — Y9389 Activity, other specified: Secondary | ICD-10-CM | POA: Diagnosis not present

## 2014-11-13 DIAGNOSIS — Z8719 Personal history of other diseases of the digestive system: Secondary | ICD-10-CM | POA: Diagnosis not present

## 2014-11-13 DIAGNOSIS — Y92410 Unspecified street and highway as the place of occurrence of the external cause: Secondary | ICD-10-CM | POA: Insufficient documentation

## 2014-11-13 DIAGNOSIS — Z720 Tobacco use: Secondary | ICD-10-CM | POA: Insufficient documentation

## 2014-11-13 DIAGNOSIS — S29091A Other injury of muscle and tendon of front wall of thorax, initial encounter: Secondary | ICD-10-CM | POA: Diagnosis present

## 2014-11-13 DIAGNOSIS — R0789 Other chest pain: Secondary | ICD-10-CM

## 2014-11-13 MED ORDER — NAPROXEN 500 MG PO TABS
500.0000 mg | ORAL_TABLET | Freq: Once | ORAL | Status: AC
Start: 2014-11-13 — End: 2014-11-13
  Administered 2014-11-13: 500 mg via ORAL
  Filled 2014-11-13: qty 1

## 2014-11-13 MED ORDER — MELOXICAM 7.5 MG PO TABS: 15.0000 mg | ORAL_TABLET | Freq: Every day | ORAL | Status: DC
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
## 2015-04-19 ENCOUNTER — Encounter (HOSPITAL_COMMUNITY): Payer: Self-pay | Admitting: *Deleted

## 2015-04-19 ENCOUNTER — Emergency Department (HOSPITAL_COMMUNITY)
Admission: EM | Admit: 2015-04-19 | Discharge: 2015-04-19 | Disposition: A | Discharge status: Home / Self Care | Payer: Self-pay | Attending: Emergency Medicine | Admitting: Emergency Medicine

## 2015-04-19 DIAGNOSIS — K029 Dental caries, unspecified: Secondary | ICD-10-CM | POA: Insufficient documentation

## 2015-04-19 DIAGNOSIS — I10 Essential (primary) hypertension: Secondary | ICD-10-CM | POA: Insufficient documentation

## 2015-04-19 DIAGNOSIS — Z87891 Personal history of nicotine dependence: Secondary | ICD-10-CM | POA: Insufficient documentation

## 2015-04-19 DIAGNOSIS — Z880 Allergy status to penicillin: Secondary | ICD-10-CM | POA: Insufficient documentation

## 2015-04-19 MED ORDER — CLINDAMYCIN HCL 150 MG PO CAPS: 150.0000 mg | ORAL_CAPSULE | Freq: Four times a day (QID) | ORAL | Status: DC

## 2015-04-19 MED ORDER — IBUPROFEN 200 MG PO TABS: 600.0000 mg | ORAL_TABLET | Freq: Three times a day (TID) | ORAL | Status: DC | PRN

## 2015-04-19 MED ORDER — HYDROCODONE-ACETAMINOPHEN 5-325 MG PO TABS: 1.0000 | ORAL_TABLET | ORAL | Status: DC | PRN

## 2015-04-19 MED ORDER — HYDROCODONE-ACETAMINOPHEN 5-325 MG PO TABS
1.0000 | ORAL_TABLET | ORAL | Status: AC
  Administered 2015-04-19: 1 via ORAL
  Filled 2015-04-19: qty 1

## 2015-04-19 NOTE — ED Provider Notes (Signed)
CSN: [id number]     Arrival date & time 04/19/15  0439 History   First MD Initiated Contact with Patient 04/19/15 0710     Chief Complaint  Patient presents with  . Dental Pain   HPI Pt woke up with a toothache.  She woke up from sleep with it.  The pain is on both sides of her lower molars.  She has two broken teeth in the wisdom tooth area.  She was having too much pain to go to work today so she came into the ED.  No fevers.  No trouble swallowing.  She has not been able to see a dentist.  Past Medical History  Diagnosis Date  . Gallbladder disease   . Hypertension    History reviewed. No pertinent past surgical history. History reviewed. No pertinent family history. History  Substance Use Topics  . Smoking status: Former Smoker -- 0.50 packs/day    Types: Cigarettes    Quit date: 03/19/2015  . Smokeless tobacco: Not on file  . Alcohol Use: No     Comment: occ   OB History    No data available     Review of Systems  All other systems reviewed and are negative.     Allergies  Penicillins  Home Medications   Prior to Admission medications   Medication Sig Start Date End Date Taking? Authorizing Provider  clindamycin (CLEOCIN) 150 MG capsule Take 1 capsule (150 mg total) by mouth every 6 (six) hours. 04/19/15   Linwood DibblesJon Ruqayya Ventress, MD  HYDROcodone-acetaminophen (NORCO/VICODIN) 5-325 MG per tablet Take 1-2 tablets by mouth every 4 (four) hours as needed. 04/19/15   Linwood DibblesJon Rakeem Colley, MD  ibuprofen (ADVIL,MOTRIN) 200 MG tablet Take 3 tablets (600 mg total) by mouth every 8 (eight) hours as needed for moderate pain. 04/19/15   Linwood DibblesJon Matti Killingsworth, MD   BP 150/102 mmHg  Pulse 97  Temp(Src) 97 F (36.1 C) (Oral)  Resp 18  SpO2 100%  LMP 04/14/2015 (Approximate) Physical Exam  Constitutional: She appears well-developed and well-nourished. No distress.  HENT:  Head: Normocephalic and atraumatic.  Right Ear: External ear normal.  Left Ear: External ear normal.  Mouth/Throat: Oropharynx is clear  and moist and mucous membranes are normal. Dental caries present. No dental abscesses or uvula swelling.    Bilateral dental caries, no swelling,   Eyes: Conjunctivae are normal. Right eye exhibits no discharge. Left eye exhibits no discharge. No scleral icterus.  Neck: Neck supple. No tracheal deviation present.  Cardiovascular: Normal rate and regular rhythm.   Pulmonary/Chest: Effort normal and breath sounds normal. No stridor. No respiratory distress. She has no wheezes.  Musculoskeletal: She exhibits no edema.  Neurological: She is alert. Cranial nerve deficit: no gross deficits.  Skin: Skin is warm and dry. No rash noted.  Psychiatric: She has a normal mood and affect.  Nursing note and vitals reviewed.   ED Course  Procedures (including critical care time) Medications  HYDROcodone-acetaminophen (NORCO/VICODIN) 5-325 MG per tablet 1 tablet (not administered)     MDM   Final diagnoses:  Dental caries    Symptoms are consistent with dentar caries. There are no obvious signs of a severe abscess. sHe has no significant facial swelling. At this time there does not appear to be any evidence of an emergency medical condition. I will refer herto a Energy managerdentist/oral surgeon. Patient will be given prescriptions  for pain medications and antibiotics.      Linwood DibblesJon Dashun Borre, MD 04/19/15 [phone]

## 2015-04-19 NOTE — Discharge Instructions (Signed)

## 2015-04-19 NOTE — ED Notes (Signed)
Pt reports bilat lower tooth pain that awoke pt from her sleep approx 0300 tonight.

## 2015-05-28 ENCOUNTER — Emergency Department (HOSPITAL_COMMUNITY)
Admission: EM | Admit: 2015-05-28 | Discharge: 2015-05-29 | Disposition: A | Discharge status: Home / Self Care | Payer: Self-pay | Attending: Emergency Medicine | Admitting: Emergency Medicine

## 2015-05-28 ENCOUNTER — Encounter (HOSPITAL_COMMUNITY): Payer: Self-pay | Admitting: Emergency Medicine

## 2015-05-28 DIAGNOSIS — Z880 Allergy status to penicillin: Secondary | ICD-10-CM | POA: Insufficient documentation

## 2015-05-28 DIAGNOSIS — Z87891 Personal history of nicotine dependence: Secondary | ICD-10-CM | POA: Insufficient documentation

## 2015-05-28 DIAGNOSIS — L0231 Cutaneous abscess of buttock: Secondary | ICD-10-CM | POA: Insufficient documentation

## 2015-05-28 DIAGNOSIS — Z8719 Personal history of other diseases of the digestive system: Secondary | ICD-10-CM | POA: Insufficient documentation

## 2015-05-28 DIAGNOSIS — I10 Essential (primary) hypertension: Secondary | ICD-10-CM | POA: Insufficient documentation

## 2015-05-28 MED ORDER — OXYCODONE-ACETAMINOPHEN 5-325 MG PO TABS: 1.0000 | ORAL_TABLET | Freq: Once | ORAL | Status: DC

## 2015-05-28 MED ORDER — KETOROLAC TROMETHAMINE 60 MG/2ML IM SOLN
60.0000 mg | Freq: Once | INTRAMUSCULAR | Status: AC
  Administered 2015-05-28: 60 mg via INTRAMUSCULAR
  Filled 2015-05-28: qty 2

## 2015-05-28 MED ORDER — ACETAMINOPHEN 500 MG PO TABS
1000.0000 mg | ORAL_TABLET | Freq: Once | ORAL | Status: AC
Start: 2015-05-28 — End: 2015-05-28
  Administered 2015-05-28: 1000 mg via ORAL
  Filled 2015-05-28: qty 2

## 2015-05-28 MED ORDER — SULFAMETHOXAZOLE-TRIMETHOPRIM 800-160 MG PO TABS
1.0000 | ORAL_TABLET | Freq: Once | ORAL | Status: AC
  Administered 2015-05-28: 1 via ORAL
  Filled 2015-05-28: qty 1

## 2015-05-28 MED ORDER — LIDOCAINE-EPINEPHRINE (PF) 2 %-1:200000 IJ SOLN
20.0000 mL | Freq: Once | INTRAMUSCULAR | Status: AC
  Administered 2015-05-28: 20 mL
  Filled 2015-05-28: qty 20

## 2015-05-28 NOTE — ED Notes (Signed)
Pt c/o "Boil" on her L buttocks since last Thursday. Pt has hx of boils but has never had this much pain with one. Pt denies drainage. A&Ox4 and ambulatory.

## 2015-05-29 MED ORDER — SULFAMETHOXAZOLE-TRIMETHOPRIM 800-160 MG PO TABS
1.0000 | ORAL_TABLET | Freq: Two times a day (BID) | ORAL | Status: AC
Start: 2015-05-29 — End: 2015-06-05

## 2015-05-29 MED ORDER — IBUPROFEN 600 MG PO TABS: 600.0000 mg | ORAL_TABLET | Freq: Three times a day (TID) | ORAL | Status: DC | PRN

## 2015-05-29 NOTE — Discharge Instructions (Signed)

## 2015-05-29 NOTE — ED Provider Notes (Signed)
CSN: [id number]     Arrival date & time 05/28/15  2053 History   First MD Initiated Contact with Patient 05/28/15 2306     Chief Complaint  Patient presents with  . Recurrent Skin Infections      HPI Presents with abscess to the right gluteal region. No hx of abscess. Present for several days. No fever or chills or spreading redness   Past Medical History  Diagnosis Date  . Gallbladder disease   . Hypertension    History reviewed. No pertinent past surgical history. No family history on file. History  Substance Use Topics  . Smoking status: Former Smoker -- 0.50 packs/day    Types: Cigarettes    Quit date: 03/19/2015  . Smokeless tobacco: Not on file  . Alcohol Use: No     Comment: occ   OB History    No data available     Review of Systems  All other systems reviewed and are negative.     Allergies  Penicillins  Home Medications   Prior to Admission medications   Medication Sig Start Date End Date Taking? Authorizing Provider  ibuprofen (ADVIL,MOTRIN) 600 MG tablet Take 1 tablet (600 mg total) by mouth every 8 (eight) hours as needed. 05/29/15   Azalia BilisKevin Siyah Mault, MD  sulfamethoxazole-trimethoprim (BACTRIM DS,SEPTRA DS) 800-160 MG per tablet Take 1 tablet by mouth 2 (two) times daily. 05/29/15 06/05/15  Azalia BilisKevin Kerrigan Gombos, MD   BP 134/75 mmHg  Pulse 94  Temp(Src) 99.5 F (37.5 C) (Oral)  Resp 16  SpO2 100%  LMP 05/10/2015 Physical Exam  Constitutional: She is oriented to person, place, and time. She appears well-developed and well-nourished.  HENT:  Head: Normocephalic.  Eyes: EOM are normal.  Neck: Normal range of motion.  Pulmonary/Chest: Effort normal.  Abdominal: She exhibits no distension.  Genitourinary:  Large right gluteal abscess noted gluteal cleft.  No surrounding erythema.  Fluctuance present.  No drainage.  Musculoskeletal: Normal range of motion.  Neurological: She is alert and oriented to person, place, and time.  Psychiatric: She has a normal mood  and affect.  Nursing note and vitals reviewed.   ED Course  Procedures (including critical care time)  INCISION AND DRAINAGE Performed by: Lyanne CoAMPOS,Kyasia Steuck M Consent: Verbal consent obtained. Risks and benefits: risks, benefits and alternatives were discussed Time out performed prior to procedure Type: abscess Body area: right gluteal cleft Anesthesia: local infiltration Incision was made with a scalpel. Local anesthetic: lidocaine 2% with epinephrine Anesthetic total: 6 ml Complexity: complex Blunt dissection to break up loculations Drainage: purulent Drainage amount: large Packing material: none Patient tolerance: Patient tolerated the procedure well with no immediate complications.     Labs Review Labs Reviewed - No data to display  Imaging Review No results found.   EKG Interpretation None      MDM   Final diagnoses:  Abscess, gluteal, right   I and D with large drainage. Dc home on abx. Return precautions given    Azalia BilisKevin Lyniah Fujita, MD 05/29/15 [phone]

## 2015-08-07 ENCOUNTER — Emergency Department (HOSPITAL_COMMUNITY)
Admission: EM | Admit: 2015-08-07 | Discharge: 2015-08-07 | Disposition: A | Discharge status: Home / Self Care | Payer: Self-pay | Attending: Emergency Medicine | Admitting: Emergency Medicine

## 2015-08-07 ENCOUNTER — Encounter (HOSPITAL_COMMUNITY): Payer: Self-pay | Admitting: Emergency Medicine

## 2015-08-07 DIAGNOSIS — K029 Dental caries, unspecified: Secondary | ICD-10-CM | POA: Insufficient documentation

## 2015-08-07 DIAGNOSIS — I10 Essential (primary) hypertension: Secondary | ICD-10-CM | POA: Insufficient documentation

## 2015-08-07 DIAGNOSIS — Z880 Allergy status to penicillin: Secondary | ICD-10-CM | POA: Insufficient documentation

## 2015-08-07 DIAGNOSIS — Z87891 Personal history of nicotine dependence: Secondary | ICD-10-CM | POA: Insufficient documentation

## 2015-08-07 DIAGNOSIS — K0889 Other specified disorders of teeth and supporting structures: Secondary | ICD-10-CM

## 2015-08-07 DIAGNOSIS — Z8719 Personal history of other diseases of the digestive system: Secondary | ICD-10-CM | POA: Insufficient documentation

## 2015-08-07 MED ORDER — IBUPROFEN 800 MG PO TABS: 800.0000 mg | ORAL_TABLET | Freq: Three times a day (TID) | ORAL | Status: DC

## 2015-08-07 MED ORDER — IBUPROFEN 800 MG PO TABS
800.0000 mg | ORAL_TABLET | Freq: Once | ORAL | Status: AC
Start: 2015-08-07 — End: 2015-08-07
  Administered 2015-08-07: 800 mg via ORAL
  Filled 2015-08-07: qty 1

## 2015-08-07 MED ORDER — LIDOCAINE-EPINEPHRINE 2 %-1:100000 IJ SOLN
1.7000 mL | Freq: Once | INTRAMUSCULAR | Status: AC
  Administered 2015-08-07: 1.7 mL
  Filled 2015-08-07: qty 20

## 2015-08-07 MED ORDER — LIDOCAINE-EPINEPHRINE 2 %-1:100000 IJ SOLN
1.7000 mL | Freq: Once | INTRAMUSCULAR | Status: DC
  Filled 2015-08-07: qty 1

## 2015-08-07 NOTE — ED Provider Notes (Signed)
CSN: [id number]     Arrival date & time 08/07/15  0132 History   First MD Initiated Contact with Patient 08/07/15 [phone]     Chief Complaint  Patient presents with  . Dental Pain     (Consider location/radiation/quality/duration/timing/severity/associated sxs/prior Treatment) Patient is a 29 y.o. female presenting with tooth pain. The history is provided by the patient. No language interpreter was used.  Dental Pain Location:  Lower Lower teeth location:  31/RL 2nd molar Quality:  Sharp Severity:  Severe Duration:  1 day Timing:  Constant Associated symptoms: no fever and no neck pain   Associated symptoms comment:  Sharp intense lower right dental pain that woke her from sleep last night. No facial swelling or fever. No difficulty swallowing. She reports history of dental decay in the problem tooth.   Past Medical History  Diagnosis Date  . Gallbladder disease   . Hypertension    History reviewed. No pertinent past surgical history. History reviewed. No pertinent family history. Social History  Substance Use Topics  . Smoking status: Former Smoker -- 0.50 packs/day    Types: Cigarettes    Quit date: 03/19/2015  . Smokeless tobacco: None  . Alcohol Use: No     Comment: occ   OB History    No data available     Review of Systems  Constitutional: Negative for fever and chills.  HENT: Positive for dental problem. Negative for trouble swallowing.   Gastrointestinal: Negative.  Negative for nausea and vomiting.  Musculoskeletal: Negative.  Negative for neck pain and neck stiffness.      Allergies  Penicillins  Home Medications   Prior to Admission medications   Medication Sig Start Date End Date Taking? Authorizing Provider  ibuprofen (ADVIL,MOTRIN) 600 MG tablet Take 1 tablet (600 mg total) by mouth every 8 (eight) hours as needed. Patient not taking: Reported on 08/07/2015 05/29/15   Azalia Bilis, MD   BP 191/123 mmHg  Pulse 96  Temp(Src) 98.4 F (36.9 C)  (Oral)  Resp 24  Wt 220 lb (99.791 kg)  SpO2 100%  LMP 07/14/2015 Physical Exam  Constitutional: She is oriented to person, place, and time. She appears well-developed and well-nourished.  HENT:  Dental decay to #31. No surrounding visualized abscess. No facial swelling. Oropharynx benign.  Neck: Normal range of motion. Neck supple.  Pulmonary/Chest: Effort normal.  Lymphadenopathy:    She has no cervical adenopathy.  Neurological: She is alert and oriented to person, place, and time.  Skin: Skin is warm and dry.    ED Course  Procedures (including critical care time) Labs Review Labs Reviewed - No data to display  Imaging Review No results found. I, Gregg Winchell A, personally reviewed and evaluated these images and lab results as part of my medical decision-making.   EKG Interpretation None     PROCEDURE: Dental block performed with lidocaine with epi by local apical injection to #31. Good anesthesia achieved.  MDM   Final diagnoses:  None    1. Dental caries 2. Dental pain  Will start on ibuprofen 800 mg and encourage dental follow up tomorrow.     Elpidio Anis, PA-C 08/07/15 5621  Devoria Albe, MD 08/07/15 0330

## 2015-08-07 NOTE — ED Notes (Signed)
Pt states she has had R lower dental pain since today that woke her up out of her sleep. Hypertensive. Hx of same w/o meds. Alert and oriented.

## 2015-08-07 NOTE — Discharge Instructions (Signed)
Dental Caries °Dental caries (also called tooth decay) is the most common oral disease. It can occur at any age but is more common in children and young adults.  °HOW DENTAL CARIES DEVELOPS  °The process of decay begins when bacteria and foods (particularly sugars and starches) combine in your mouth to produce plaque. Plaque is a substance that sticks to the hard, outer surface of a tooth (enamel). The bacteria in plaque produce acids that attack enamel. These acids may also attack the root surface of a tooth (cementum) if it is exposed. Repeated attacks dissolve these surfaces and create holes in the tooth (cavities). If left untreated, the acids destroy the other layers of the tooth.  °RISK FACTORS °· Frequent sipping of sugary beverages.   °· Frequent snacking on sugary and starchy foods, especially those that easily get stuck in the teeth.   °· Poor oral hygiene.   °· Dry mouth.   °· Substance abuse such as methamphetamine abuse.   °· Broken or poor-fitting dental restorations.   °· Eating disorders.   °· Gastroesophageal reflux disease (GERD).   °· Certain radiation treatments to the head and neck. °SYMPTOMS °In the early stages of dental caries, symptoms are seldom present. Sometimes white, chalky areas may be seen on the enamel or other tooth layers. In later stages, symptoms may include: °· Pits and holes on the enamel. °· Toothache after sweet, hot, or cold foods or drinks are consumed. °· Pain around the tooth. °· Swelling around the tooth. °DIAGNOSIS  °Most of the time, dental caries is detected during a regular dental checkup. A diagnosis is made after a thorough medical and dental history is taken and the surfaces of your teeth are checked for signs of dental caries. Sometimes special instruments, such as lasers, are used to check for dental caries. Dental X-ray exams may be taken so that areas not visible to the eye (such as between the contact areas of the teeth) can be checked for cavities.    °TREATMENT  °If dental caries is in its early stages, it may be reversed with a fluoride treatment or an application of a remineralizing agent at the dental office. Thorough brushing and flossing at home is needed to aid these treatments. If it is in its later stages, treatment depends on the location and extent of tooth destruction:  °· If a small area of the tooth has been destroyed, the destroyed area will be removed and cavities will be filled with a material such as gold, silver amalgam, or composite resin.   °· If a large area of the tooth has been destroyed, the destroyed area will be removed and a cap (crown) will be fitted over the remaining tooth structure.   °· If the center part of the tooth (pulp) is affected, a procedure called a root canal will be needed before a filling or crown can be placed.   °· If most of the tooth has been destroyed, the tooth may need to be pulled (extracted). °HOME CARE INSTRUCTIONS °You can prevent, stop, or reverse dental caries at home by practicing good oral hygiene. Good oral hygiene includes: °· Thoroughly cleaning your teeth at least twice a day with a toothbrush and dental floss.   °· Using a fluoride toothpaste. A fluoride mouth rinse may also be used if recommended by your dentist or health care provider.   °· Restricting the amount of sugary and starchy foods and sugary liquids you consume.   °· Avoiding frequent snacking on these foods and sipping of these liquids.   °· Keeping regular visits with   a dentist for checkups and cleanings. PREVENTION   Practice good oral hygiene.  Consider a dental sealant. A dental sealant is a coating material that is applied by your dentist to the pits and grooves of teeth. The sealant prevents food from being trapped in them. It may protect the teeth for several years.  Ask about fluoride supplements if you live in a community without fluorinated water or with water that has a low fluoride content. Use fluoride supplements  as directed by your dentist or health care provider.  Allow fluoride varnish applications to teeth if directed by your dentist or health care provider. Document Released: 08/31/2002 Document Revised: 04/25/2014 Document Reviewed: 12/11/2012 [facility] Patient Information 2015 [place], [place]. This information is not intended to replace advice given to you by your health care provider. Make sure you discuss any questions you have with your health care provider.  Dental Pain A tooth ache may be caused by cavities (tooth decay). Cavities expose the nerve of the tooth to air and hot or cold temperatures. It may come from an infection or abscess (also called a boil or furuncle) around your tooth. It is also often caused by dental caries (tooth decay). This causes the pain you are having. DIAGNOSIS  Your caregiver can diagnose this problem by exam. TREATMENT   If caused by an infection, it may be treated with medications which kill germs (antibiotics) and pain medications as prescribed by your caregiver. Take medications as directed.  Only take over-the-counter or prescription medicines for pain, discomfort, or fever as directed by your caregiver.  Whether the tooth ache today is caused by infection or dental disease, you should see your dentist as soon as possible for further care. SEEK MEDICAL CARE IF: The exam and treatment you received today has been provided on an emergency basis only. This is not a substitute for complete medical or dental care. If your problem worsens or new problems (symptoms) appear, and you are unable to meet with your dentist, call or return to this location. SEEK IMMEDIATE MEDICAL CARE IF:   You have a fever.  You develop redness and swelling of your face, jaw, or neck.  You are unable to open your mouth.  You have severe pain uncontrolled by pain medicine. MAKE SURE YOU:   Understand these instructions.  Will watch your condition.  Will get help right away if  you are not doing well or get worse. Document Released: 12/09/2005 Document Revised: 03/02/2012 Document Reviewed: 07/27/2008 [facility] Patient Information 2015 [place], [place]. This information is not intended to replace advice given to you by your health care provider. Make sure you discuss any questions you have with your health care provider.  Dental Care and Dentist Visits Dental care supports good overall health. Regular dental visits can also help you avoid dental pain, bleeding, infection, and other more serious health problems in the future. It is important to keep the mouth healthy because diseases in the teeth, gums, and other oral tissues can spread to other areas of the body. Some problems, such as diabetes, heart disease, and pre-term labor have been associated with poor oral health.  See your dentist every 6 months. If you experience emergency problems such as a toothache or broken tooth, go to the dentist right away. If you see your dentist regularly, you may catch problems early. It is easier to be treated for problems in the early stages.  WHAT TO EXPECT AT A DENTIST VISIT  Your dentist will look for many  common oral health problems and recommend proper treatment. At your regular dental visit, you can expect:  Gentle cleaning of the teeth and gums. This includes scraping and polishing. This helps to remove the sticky substance around the teeth and gums (plaque). Plaque forms in the mouth shortly after eating. Over time, plaque hardens on the teeth as tartar. If tartar is not removed regularly, it can cause problems. Cleaning also helps remove stains.  Periodic X-rays. These pictures of the teeth and supporting bone will help your dentist assess the health of your teeth.  Periodic fluoride treatments. Fluoride is a natural mineral shown to help strengthen teeth. Fluoride treatmentinvolves applying a fluoride gel or varnish to the teeth. It is most commonly done in  children.  Examination of the mouth, tongue, jaws, teeth, and gums to look for any oral health problems, such as:  Cavities (dental caries). This is decay on the tooth caused by plaque, sugar, and acid in the mouth. It is best to catch a cavity when it is small.  Inflammation of the gums caused by plaque buildup (gingivitis).  Problems with the mouth or malformed or misaligned teeth.  Oral cancer or other diseases of the soft tissues or jaws. KEEP YOUR TEETH AND GUMS HEALTHY For healthy teeth and gums, follow these general guidelines as well as your dentist's specific advice:  Have your teeth professionally cleaned at the dentist every 6 months.  Brush twice daily with a fluoride toothpaste.  Floss your teeth daily.  Ask your dentist if you need fluoride supplements, treatments, or fluoride toothpaste.  Eat a healthy diet. Reduce foods and drinks with added sugar.  Avoid smoking. TREATMENT FOR ORAL HEALTH PROBLEMS If you have oral health problems, treatment varies depending on the conditions present in your teeth and gums.  Your caregiver will most likely recommend good oral hygiene at each visit.  For cavities, gingivitis, or other oral health disease, your caregiver will perform a procedure to treat the problem. This is typically done at a separate appointment. Sometimes your caregiver will refer you to another dental specialist for specific tooth problems or for surgery. SEEK IMMEDIATE DENTAL CARE IF:  You have pain, bleeding, or soreness in the gum, tooth, jaw, or mouth area.  A permanent tooth becomes loose or separated from the gum socket.  You experience a blow or injury to the mouth or jaw area. Document Released: 08/21/2011 Document Revised: 03/02/2012 Document Reviewed: 08/21/2011 [facility] Patient Information 2015 [place], [place]. This information is not intended to replace advice given to you by your health care provider. Make sure you discuss any questions you  have with your health care provider.  Emergency Department Resource Guide 1) Find a Doctor and Pay Out of Pocket Although you won't have to find out who is covered by your insurance plan, it is a good idea to ask around and get recommendations. You will then need to call the office and see if the doctor you have chosen will accept you as a new patient and what types of options they offer for patients who are self-pay. Some doctors offer discounts or will set up payment plans for their patients who do not have insurance, but you will need to ask so you aren't surprised when you get to your appointment.  2) Contact Your Local Health Department Not all health departments have doctors that can see patients for sick visits, but many do, so it is worth a call to see if yours does. If you don't know  where your local health department is, you can check in your phone book. The CDC also has a tool to help you locate your state's health department, and many state websites also have listings of all of their local health departments.  3) Find a Walk-in Clinic If your illness is not likely to be very severe or complicated, you may want to try a walk in clinic. These are popping up all over the country in pharmacies, drugstores, and shopping centers. They're usually staffed by nurse practitioners or physician assistants that have been trained to treat common illnesses and complaints. They're usually fairly quick and inexpensive. However, if you have serious medical issues or chronic medical problems, these are probably not your best option.  No Primary Care Doctor: - Call Health Connect at  [phone] - they can help you locate a primary care doctor that  accepts your insurance, provides certain services, etc. - Physician Referral Service- [phone]  Chronic Pain Problems: Organization         Address  Phone   Notes  [facility]  [phone] Patients need to be referred by their primary  care doctor.   Medication Assistance: Organization         Address  Phone   Notes  [facility] Medication [facility] [address] [place] [place], [place] [address] [phone] --Must be a resident of [facility] -- Must have NO insurance coverage whatsoever (no Medicaid/ Medicare, etc.) -- The pt. MUST have a primary care doctor that directs their care regularly and follows them in the community   [facility]  [phone]   [employer]  [phone]    Agencies that provide inexpensive medical care: Organization         Address  Phone   Notes  [facility]  [phone]   [facility]    [phone]   [facility] [address] [place], [place] [address] [phone]   [facility] [address] [place] [phone]   [facility]    [phone]   [facility]    [phone]   [facility]  [address], [place] Phone:  [phone], Fax:  [phone] Hours of Operation:  9 am - 6 pm, M-F.  Also accepts Medicaid/Medicare and self-pay.  [facility]  [address], [place] Phone: [phone], Fax: [phone]. Hours of Operation:  8:30 am - 5:30 pm, M-F.  Also accepts Medicaid and self-pay.  [facility] [place] [address] [place] Phone: [phone]   [facility] [address] Natasha Bence [place], [place] [phone], Ext. [phone] Mondays & Thursdays: 7-9 AM.  First 15 patients are seen on a first come, first serve basis.    Medicaid-accepting [facility] Providers:  Organization         Address  Phone   Notes  [facility] [address], [place] [phone] Also accepts self-pay patients.  [facility] [address] Laurell Josephs [place], [place]  [phone]   [facility] [address], [place] [phone]   [facility] [address] [place] [phone]   Renaye Rakers [address], [place]   [phone] Only accepts [employer]  patients after they have their name applied to their card.   Self-Pay (no insurance) in [facility]:  Organization         Address  Phone   Notes  Sickle Cell Patients, [facility] [address] [place], [place] [phone]   [facility] [address] [place], [place] [phone]   [facility] [place]  [address], [place] [phone]   [facility]/Dr. Osei-Bonsu  [address], [place] or [address], [place] [phone] Phone number for both [place] and [place] locations is the same.  [facility] [address], [place] [phone]   [facility] [address] [place] [address] [place] or [address] [phone]   [facility] [address], [place] [phone], phone; [phone], fax Sees patients 1st and 3rd Saturday of every month.  Must not qualify for public or private insurance (i.e. Medicaid, Medicare, [place] Health Choice, Veterans' Benefits)  Household income should be no more than 200% of the poverty level The clinic cannot treat you if you are pregnant or think you are pregnant  Sexually transmitted diseases are not treated at the clinic.    Dental Care: Organization         Address  Phone  Notes  [facility] Department of [facility] [address] [place], [place] [phone] Accepts children up to age 74 who are enrolled in [place] or [place] Health Choice; pregnant women with a Medicaid card; and children who have applied for Medicaid or [place] Health Choice, but were declined, whose parents can pay a reduced fee at  time of service.  [facility] Department of [facility]  [address], [place] [phone] Accepts children up to age 58 who are enrolled in [place] or [place] Health Choice; pregnant women with a Medicaid card; and children who have applied for Medicaid or  Health Choice, but were declined, whose parents can pay a reduced fee at time of service.  [facility]  [address] [place], [place] [phone] Patients are seen by appointment only. Walk-ins are not accepted. [facility] will see patients 28 years of age and older. Monday - Tuesday (8am-5pm) Most Wednesdays (8:30-5pm) $30 per visit, cash only  [facility] Adult Dental Access [facility]  [address], [facility] [phone] Patients are seen by appointment only. Walk-ins are not accepted. [facility] will see patients 52 years of age and older. One Wednesday Evening (Monthly: Volunteer Based).  $30 per visit, cash only  [employer]  [phone] for adults; Children under age 74, call Graduate Pediatric Dentistry at [phone]. Children aged 23-14, please call [phone] to request a pediatric application.  Dental services are provided in all areas of dental care including fillings, crowns and bridges, complete and partial dentures, implants, gum treatment, root canals, and extractions. Preventive care is also provided. Treatment is provided to both adults and children. Patients are selected via a lottery and there is often a waiting list.   [facility] [address], [place]  [phone] [email or web address]   [facility] [address] [place], [place] [phone], Ext. [phone] Second and Fourth Thursday of each month, opens at 6:30 AM; Clinic ends at 9 AM.  Patients  are seen on a first-come first-served basis, and a limited number are seen during each clinic.   [facility]  [address] Ether Griffins  [place], [place] [phone]   Eligibility Requirements You must have lived in [place], [place], or [place] counties for at least the last three months.   You cannot be eligible for state or federal sponsored [place], including [employer], [place], or [employer].   You generally cannot be eligible for healthcare insurance through your employer.    How to apply: Eligibility screenings are held every Tuesday and Wednesday afternoon from 1:00 pm until 4:00 pm. You do not need an appointment for the interview!  [facility] [address] [place], [place] [phone]   [facility] Health Department  [phone]   [facility] Health Department  [phone]   [facility] Health Department  [phone]

## 2016-08-20 ENCOUNTER — Encounter (HOSPITAL_COMMUNITY): Payer: Self-pay | Admitting: Emergency Medicine

## 2016-08-20 DIAGNOSIS — I10 Essential (primary) hypertension: Secondary | ICD-10-CM | POA: Insufficient documentation

## 2016-08-20 DIAGNOSIS — Z87891 Personal history of nicotine dependence: Secondary | ICD-10-CM | POA: Insufficient documentation

## 2016-08-20 DIAGNOSIS — L0231 Cutaneous abscess of buttock: Secondary | ICD-10-CM | POA: Insufficient documentation

## 2016-08-20 MED ORDER — IBUPROFEN 400 MG PO TABS: 800.0000 mg | ORAL_TABLET | Freq: Once | ORAL | Status: DC

## 2016-08-20 MED ORDER — IBUPROFEN 400 MG PO TABS
400.0000 mg | ORAL_TABLET | Freq: Once | ORAL | Status: AC | PRN
  Administered 2016-08-20: 400 mg via ORAL

## 2016-08-20 MED ORDER — IBUPROFEN 400 MG PO TABS
ORAL_TABLET | ORAL | Status: AC
  Filled 2016-08-20: qty 1

## 2016-08-20 NOTE — ED Triage Notes (Signed)
Pt arrives with c/o abscess to buttock since Thursday of last week. Reports pain with sitting and fever, unable to report how high.

## 2016-08-21 ENCOUNTER — Emergency Department (HOSPITAL_COMMUNITY)
Admission: EM | Admit: 2016-08-21 | Discharge: 2016-08-21 | Disposition: A | Discharge status: Home / Self Care | Payer: Self-pay | Attending: Emergency Medicine | Admitting: Emergency Medicine

## 2016-08-21 DIAGNOSIS — L0291 Cutaneous abscess, unspecified: Secondary | ICD-10-CM

## 2016-08-21 MED ORDER — SULFAMETHOXAZOLE-TRIMETHOPRIM 800-160 MG PO TABS: 1.0000 | ORAL_TABLET | Freq: Two times a day (BID) | ORAL | 0 refills | Status: AC

## 2016-08-21 MED ORDER — HYDROCODONE-ACETAMINOPHEN 5-325 MG PO TABS: 1.0000 | ORAL_TABLET | ORAL | 0 refills | Status: DC | PRN

## 2016-08-21 MED ORDER — LIDOCAINE-EPINEPHRINE (PF) 2 %-1:200000 IJ SOLN
20.0000 mL | Freq: Once | INTRAMUSCULAR | Status: AC
  Administered 2016-08-21: 20 mL
  Filled 2016-08-21: qty 20

## 2016-08-21 NOTE — ED Provider Notes (Signed)
[facility]-EMERGENCY DEPT Provider Note   CSN: [id number] Arrival date & time: 08/20/16  2203     History   Chief Complaint Chief Complaint  Patient presents with  . Abscess    HPI Tracy Weaver is a 30 y.o. female.  Patient presents with worsening symptoms of painful swollen area to left inferior buttock. Symptoms started 1-2 days ago. No drainage. No history of abscess. She reports a fever at home. It is extremely painful to try to sit. No rectal pain or difficulty passing stool. No N, V.     Abscess  Associated symptoms: fever   Associated symptoms: no nausea     Past Medical History:  Diagnosis Date  . Gallbladder disease   . Hypertension     Patient Active Problem List   Diagnosis Date Noted  . Gallstones with obstruction of gallbladder 09/27/2013  . Fatty liver disease, nonalcoholic 09/27/2013  . Obesity 09/27/2013  . Gallstones without obstruction of gallbladder 09/27/2013    History reviewed. No pertinent surgical history.  OB History    No data available       Home Medications    Prior to Admission medications   Medication Sig Start Date End Date Taking? Authorizing Provider  ibuprofen (ADVIL,MOTRIN) 800 MG tablet Take 1 tablet (800 mg total) by mouth 3 (three) times daily. 08/07/15   Elpidio Anis, PA-C    Family History No family history on file.  Social History Social History  Substance Use Topics  . Smoking status: Former Smoker    Packs/day: 0.50    Types: Cigarettes    Quit date: 03/19/2015  . Smokeless tobacco: Never Used  . Alcohol use No     Comment: occ     Allergies   Penicillins   Review of Systems Review of Systems  Constitutional: Positive for fever.  Gastrointestinal: Negative.  Negative for abdominal pain and nausea.  Musculoskeletal: Negative.  Negative for myalgias.  Skin: Positive for wound.       C/O Abscess.  Neurological: Negative.      Physical Exam Updated Vital Signs BP (!) 162/124   Pulse (!) 127    Temp 99.7 F (37.6 C) (Oral)   Resp 20   SpO2 100%   Physical Exam  Constitutional: She appears well-developed and well-nourished.  HENT:  Head: Normocephalic.  Neck: Normal range of motion. Neck supple.  Pulmonary/Chest: Effort normal.  Abdominal: Soft. There is no tenderness. There is no rebound and no guarding.  Genitourinary:  Genitourinary Comments: No rectal tenderness or surrounding induration.  Musculoskeletal: Normal range of motion.  Neurological: She is alert.  Skin: Skin is warm and dry. No rash noted.  Large raised, tender swollen area with central fluctuance on medial and inferior left buttock. No active drainage.   Psychiatric: She has a normal mood and affect.     ED Treatments / Results  Labs (all labs ordered are listed, but only abnormal results are displayed) Labs Reviewed - No data to display  EKG  EKG Interpretation None       Radiology No results found.  Procedures Procedures (including critical care time) INCISION AND DRAINAGE Performed by: Elpidio Anis A Consent: Verbal consent obtained. Risks and benefits: risks, benefits and alternatives were discussed Type: abscess  Body area: left inferior buttock  Anesthesia: local infiltration  Incision was made with a scalpel.  Local anesthetic: lidocaine 2% w/epinephrine  Anesthetic total: 3 ml  Complexity: complex Blunt dissection to break up loculations  Drainage: purulent  Drainage  amount: large, bloody/purulent  Packing material: none  Patient tolerance: Patient tolerated the procedure well with no immediate complications.    Medications Ordered in ED Medications  ibuprofen (ADVIL,MOTRIN) 400 MG tablet (not administered)  ibuprofen (ADVIL,MOTRIN) tablet 400 mg (400 mg Oral Given 08/20/16 2228)  lidocaine-EPINEPHrine (XYLOCAINE W/EPI) 2 %-1:200000 (PF) injection 20 mL (20 mLs Infiltration Given 08/21/16 0147)     Initial Impression / Assessment and Plan / ED Course  I  have reviewed the triage vital signs and the nursing notes.  Pertinent labs & imaging results that were available during my care of the patient were reviewed by me and considered in my medical decision making (see chart for details).  Clinical Course    Painful swollen area to left buttock c/w abscess requiring drainage as per I&D note above. Low grade fever in ED with reported fever at home. Will start on abx, provide pain management and encourage 2 day recheck.  Final Clinical Impressions(s) / ED Diagnoses   Final diagnoses:  None   1. Cutaneous abcess left buttock  New Prescriptions New Prescriptions   No medications on file     Elpidio AnisShari Laurin Morgenstern, PA-C 08/21/16 [id number]    Shon Batonourtney F Horton, MD 08/24/16 [phone]

## 2017-05-27 ENCOUNTER — Ambulatory Visit: Admit: 2017-05-27 | Discharge: 2017-05-27 | Attending: Family Medicine | Primary: Family Medicine

## 2017-05-27 DIAGNOSIS — N3000 Acute cystitis without hematuria: Secondary | ICD-10-CM

## 2017-05-27 LAB — POC URINE WITH MICROSCOPIC
Bilirubin Urine: 0 mg/dL
Blood, Urine: POSITIVE — AB
Glucose, Ur: NEGATIVE
Ketones, Urine: NEGATIVE
Leukocytes, UA: NEGATIVE
Nitrite, Urine: NEGATIVE
Protein, UA: NEGATIVE
RBC Urine, POC: 6
Specific Gravity, UA: 1.005 (ref 1.005–1.030)
Urobilinogen, Urine: NORMAL
WBC Urine, POC: 5
pH, UA: 7.5 (ref 4.5–8.0)

## 2017-05-27 MED ORDER — NITROFURANTOIN MONOHYD MACRO 100 MG PO CAPS
100 MG | ORAL_CAPSULE | Freq: Two times a day (BID) | ORAL | 0 refills | Status: AC
Start: 2017-05-27 — End: 2017-06-06

## 2017-05-27 MED ORDER — LEVONORGEST-ETH ESTRAD 91-DAY 0.15-0.03 &0.01 MG PO TABS
0.15-0.03 | ORAL_TABLET | Freq: Every day | ORAL | 1 refills | Status: DC
Start: 2017-05-27 — End: 2017-07-30

## 2017-05-27 NOTE — Progress Notes (Signed)
Subjective:      Patient ID: Valerie Todd is a 31 y.o. female.    Allergies      Some       celexa  And   Mild   depression      Urinary Frequency    This is a new problem. The current episode started in the past 7 days. The problem has been gradually improving. The quality of the pain is described as burning. The patient is experiencing no pain. There has been no fever. Associated symptoms include frequency and urgency. Pertinent negatives include no nausea. She has tried increased fluids for the symptoms. The treatment provided moderate relief.     Past Medical History:   Diagnosis Date   . Allergic rhinitis      Past Surgical History:   Procedure Laterality Date   . TONSILLECTOMY AND ADENOIDECTOMY  1994   . WISDOM TOOTH EXTRACTION  2018     Social History     Social History   . Marital status: Single     Spouse name: N/A   . Number of children: N/A   . Years of education: N/A     Occupational History   . Not on file.     Social History Main Topics   . Smoking status: Never Smoker   . Smokeless tobacco: Never Used   . Alcohol use No   . Drug use: No   . Sexual activity: Yes     Other Topics Concern   . Not on file     Social History Narrative   . No narrative on file     Family History   Problem Relation Age of Onset   . High Blood Pressure Mother      Review of Systems   Constitutional: Negative for appetite change, fatigue and fever.   HENT: Positive for congestion. Negative for ear pain, postnasal drip and sore throat.           Mild  From  Allergies    Eyes: Negative for pain and visual disturbance.   Respiratory: Negative for cough, chest tightness, shortness of breath and wheezing.    Cardiovascular: Negative for chest pain, palpitations and leg swelling.   Gastrointestinal: Negative for abdominal pain, constipation and nausea.         Mild  Suprapubic  pain   Genitourinary: Positive for frequency and urgency. Negative for dysuria.   Musculoskeletal: Negative for arthralgias, joint swelling, neck pain and  neck stiffness.   Skin: Negative for rash.   Neurological: Negative for dizziness, weakness, numbness and headaches.   Hematological: Negative for adenopathy. Does not bruise/bleed easily.   Psychiatric/Behavioral: Negative for behavioral problems and sleep disturbance. The patient is not nervous/anxious.      BP 134/74 (Site: Right Arm, Position: Sitting, Cuff Size: Medium Adult)   Pulse 80   Resp 14   Ht 5\' 7"  (1.702 m)   Wt 234 lb (106.1 kg)   LMP  (Exact Date)   Breastfeeding? No   BMI 36.65 kg/m   Objective:   Physical Exam   Constitutional: She is oriented to person, place, and time. She appears well-developed and well-nourished.   HENT:   Head: Normocephalic and atraumatic.   Right Ear: External ear normal.   Left Ear: External ear normal.   Nose: Nose normal.   Mouth/Throat: Oropharynx is clear and moist.    Mild  congestion   Eyes: Conjunctivae and EOM are normal. Pupils are equal, round, and  reactive to light. No scleral icterus.   Neck: Normal range of motion. Neck supple. No JVD present. No thyromegaly present.   Cardiovascular: Normal rate, regular rhythm, normal heart sounds and intact distal pulses.    Pulmonary/Chest: Effort normal and breath sounds normal. She has no wheezes. She has no rales.   Abdominal: Soft. Bowel sounds are normal. She exhibits no distension and no mass. There is no tenderness.     Slight  Suprapubic pain   Musculoskeletal: Normal range of motion. She exhibits no tenderness.   Lymphadenopathy:     She has no cervical adenopathy.   Neurological: She is alert and oriented to person, place, and time. She has normal reflexes. No cranial nerve deficit.   Skin: Skin is warm and dry. No rash noted.   Psychiatric: She has a normal mood and affect.   Nursing note and vitals reviewed.      Assessment:       Diagnosis Orders   1. Acute cystitis without hematuria  nitrofurantoin, macrocrystal-monohydrate, (MACROBID) 100 MG capsule   2. Major depressive disorder with single episode,  in full remission (HCC)  Levonorgest-Eth Estrad 91-Day 0.15-0.03 &0.01 MG TABS   3. Chronic seasonal allergic rhinitis due to pollen     4. Urinary frequency  POC URINE with Microscopic         Plan:      Current Outpatient Prescriptions   Medication Sig Dispense Refill   . cetirizine (ZYRTEC) 10 MG tablet Take 10 mg by mouth daily     . nitrofurantoin, macrocrystal-monohydrate, (MACROBID) 100 MG capsule Take 1 capsule by mouth 2 times daily for 10 days 20 capsule 0   . citalopram (CELEXA) 40 MG tablet Take 0.5 tablets by mouth daily 30 tablet    . Levonorgest-Eth Estrad 91-Day 0.15-0.03 &0.01 MG TABS Take 1 tablet by mouth daily 91 tablet 1     No current facility-administered medications for this visit.          Orders Placed This Encounter   Procedures   . POC URINE with Microscopic     Results for orders placed or performed in visit on 05/27/17   POC URINE with Microscopic   Result Value Ref Range    Color, UA Yellow     Clarity, UA Clear Clear    Glucose, Ur Negative     Bilirubin Urine 0 mg/dL    Ketones, Urine Negative     Specific Gravity, UA 1.005 1.005 - 1.030    Blood, Urine Positive (A)     pH, UA 7.5 4.5 - 8.0    Protein, UA Negative Negative    Nitrite, Urine Negative     Leukocytes, UA Negative     Urobilinogen, Urine Normal     rbc urine, poc 6  to 8     wbc urine, poc 5 to  8     bacteria urine, poc 1+     yeast urine, poc      casts urine, poc      epi cells urine, poc      crystals urine, poc          macrobid   For  uti and  And  rx  For  5  Days   And  See  Prn

## 2017-07-30 MED ORDER — JOLESSA 0.15-0.03 MG PO TABS
PACK | Freq: Every day | ORAL | 11 refills | Status: DC
Start: 2017-07-30 — End: 2018-08-04

## 2017-07-30 NOTE — Telephone Encounter (Signed)
Date of last visit:  05/27/2017  Date of next visit:  Visit date not found    Requested Prescriptions     Signed Prescriptions Disp Refills   . JOLESSA 0.15-0.03 MG per tablet 1 packet 11     Sig: Take 1 tablet by mouth daily     Authorizing Provider: Kennis CarinaREMOULIS, EDWARD L     Ordering User: Manmeet Arzola L

## 2017-10-13 MED ORDER — CITALOPRAM HYDROBROMIDE 20 MG PO TABS
20 | ORAL_TABLET | Freq: Every day | ORAL | 2 refills | Status: DC
Start: 2017-10-13 — End: 2018-01-30

## 2017-10-13 NOTE — Telephone Encounter (Signed)
Date of last visit:  05/27/2017  Date of next visit:  Visit date not found    Requested Prescriptions     Signed Prescriptions Disp Refills   . citalopram (CELEXA) 20 MG tablet 90 tablet 2     Sig: Take 1 tablet by mouth daily     Authorizing Provider: Kennis CarinaREMOULIS, EDWARD L     Ordering User: Tegh Franek L

## 2017-10-16 NOTE — Telephone Encounter (Signed)
Pt scheduled for 12/01/17

## 2017-10-16 NOTE — Telephone Encounter (Signed)
MOM for Huntley DecSara to call back.

## 2017-10-16 NOTE — Telephone Encounter (Signed)
Probable should see her once in December on celexa and then every about 8 months or so

## 2017-11-03 ENCOUNTER — Ambulatory Visit
Admit: 2017-11-03 | Discharge: 2017-11-03 | Payer: PRIVATE HEALTH INSURANCE | Attending: Family Medicine | Primary: Family Medicine

## 2017-11-03 DIAGNOSIS — R5383 Other fatigue: Secondary | ICD-10-CM

## 2017-11-03 LAB — POCT GLUCOSE: Glucose: 71 mg/dL

## 2017-11-03 LAB — POCT HEMOGLOBIN: Hemoglobin: 16.5

## 2017-11-03 NOTE — Progress Notes (Signed)
Subjective:      Patient ID: Valerie Todd is a 31 y.o. female.      Anxiety  Noted    Stable  And  Fatigue   Noted       Stress  With  Work       Periods  Every  3 mths and perimensrual       Past Medical History:   Diagnosis Date   . Allergic rhinitis      Review of Systems   Constitutional: Negative for appetite change, fatigue and fever.   HENT: Negative for congestion, ear pain, postnasal drip and sore throat.    Eyes: Negative for pain and visual disturbance.   Respiratory: Negative for cough, chest tightness, shortness of breath and wheezing.    Cardiovascular: Negative for chest pain, palpitations and leg swelling.   Gastrointestinal: Negative for abdominal pain, constipation and nausea.   Genitourinary: Negative for dysuria and frequency.   Musculoskeletal: Negative for arthralgias, joint swelling, neck pain and neck stiffness.   Skin: Negative for rash.   Neurological: Negative for dizziness, weakness, numbness and headaches.   Hematological: Negative for adenopathy. Does not bruise/bleed easily.   Psychiatric/Behavioral: Negative for behavioral problems and sleep disturbance. The patient is not nervous/anxious.      BP 122/74 (Site: Right Upper Arm, Position: Sitting, Cuff Size: Medium Adult)   Pulse 80   Resp 14   Ht 5\' 7"  (1.702 m)   Wt 247 lb 4 oz (112.2 kg)   Breastfeeding? No   BMI 38.72 kg/m   Objective:   Physical Exam   Constitutional: She is oriented to person, place, and time. She appears well-developed and well-nourished.   HENT:   Head: Normocephalic and atraumatic.   Right Ear: External ear normal.   Left Ear: External ear normal.   Nose: Nose normal.   Mouth/Throat: Oropharynx is clear and moist.     congestion   Eyes: Pupils are equal, round, and reactive to light. Conjunctivae and EOM are normal. No scleral icterus.   Neck: Normal range of motion. Neck supple. No JVD present. No thyromegaly present.   Cardiovascular: Normal rate, regular rhythm, normal heart sounds and intact distal  pulses.    Pulmonary/Chest: Effort normal and breath sounds normal. She has no wheezes. She has no rales.   Abdominal: Soft. Bowel sounds are normal. She exhibits no distension and no mass. There is no tenderness.   Musculoskeletal: Normal range of motion. She exhibits no tenderness.   Lymphadenopathy:     She has no cervical adenopathy.   Neurological: She is alert and oriented to person, place, and time. She has normal reflexes. No cranial nerve deficit.   Skin: Skin is warm and dry. No rash noted.   Psychiatric: She has a normal mood and affect.   Nursing note and vitals reviewed.      Assessment:       Diagnosis Orders   1. Fatigue, unspecified type  POCT hemoglobin    POCT Glucose   2. Chronic seasonal allergic rhinitis due to pollen     3. Multinodular thyroid  T4, Free    TSH without Reflex   4. Major depressive disorder with single episode, in full remission [facility](HCC)           Plan:      Current Outpatient Prescriptions   Medication Sig Dispense Refill   . citalopram (CELEXA) 20 MG tablet Take 1 tablet by mouth daily 90 tablet 2   . JOLESSA  0.15-0.03 MG per tablet Take 1 tablet by mouth daily 1 packet 11   . cetirizine (ZYRTEC) 10 MG tablet Take 10 mg by mouth daily       No current facility-administered medications for this visit.          Orders Placed This Encounter   Procedures   . T4, Free     Standing Status:   Future     Standing Expiration Date:   11/03/2018   . TSH without Reflex     Standing Status:   Future     Standing Expiration Date:   11/03/2018   . POCT hemoglobin   . POCT Glucose     Results for orders placed or performed in visit on 11/03/17   POCT hemoglobin   Result Value Ref Range    Hemoglobin 16.5    POCT Glucose   Result Value Ref Range    Glucose 71 mg/dL         Try allegra  For  Allergies   And check   Thyroid    Levels   And if  All  Good   May  Try  next  celexa   40  Mg     Kennis CarinaEdward L Kylian Loh, MD

## 2017-11-04 LAB — TSH: TSH: 0.64 u[IU]/mL (ref 0.40–4.10)

## 2017-11-04 LAB — FREE T4: T4 Free: 0.85 ng/dL (ref 0.80–1.90)

## 2017-11-05 NOTE — Telephone Encounter (Signed)
-----   Message from Kennis CarinaEdward L Tremoulis, MD sent at 11/05/2017  5:31 AM EST -----  Call thyroid  Levels are normal

## 2017-11-05 NOTE — Telephone Encounter (Signed)
Pt informed.

## 2017-12-01 ENCOUNTER — Encounter: Attending: Family Medicine | Primary: Family Medicine

## 2017-12-21 ENCOUNTER — Encounter (HOSPITAL_COMMUNITY): Payer: Self-pay

## 2017-12-21 ENCOUNTER — Other Ambulatory Visit: Payer: Self-pay

## 2017-12-21 DIAGNOSIS — R000 Tachycardia, unspecified: Secondary | ICD-10-CM | POA: Insufficient documentation

## 2017-12-21 DIAGNOSIS — L0231 Cutaneous abscess of buttock: Secondary | ICD-10-CM | POA: Insufficient documentation

## 2017-12-21 NOTE — ED Triage Notes (Addendum)
Pt reports abscess in buttocks area. Pt reports fever at home. Pt presents w/ HR 140s in triage. Pt denies sob and cp. Pt afebrile in triage. Pt reports feeling "hot". Pt A+OX4, speaking in complete sentences, NAD.

## 2017-12-22 ENCOUNTER — Emergency Department (HOSPITAL_COMMUNITY)
Admission: EM | Admit: 2017-12-22 | Discharge: 2017-12-22 | Disposition: A | Discharge status: Home / Self Care | Payer: Self-pay | Attending: Emergency Medicine | Admitting: Emergency Medicine

## 2017-12-22 DIAGNOSIS — L0231 Cutaneous abscess of buttock: Secondary | ICD-10-CM

## 2017-12-22 LAB — I-STAT BETA HCG BLOOD, ED (MC, WL, AP ONLY): I-stat hCG, quantitative: <5 m[IU]/mL (ref <5)

## 2017-12-22 MED ORDER — LIDOCAINE HCL (PF) 1 % IJ SOLN
INTRAMUSCULAR | Status: AC
  Administered 2017-12-22: 03:00:00
  Filled 2017-12-22: qty 10

## 2017-12-22 MED ORDER — HYDROCODONE-ACETAMINOPHEN 5-325 MG PO TABS
2.0000 | ORAL_TABLET | Freq: Once | ORAL | Status: AC
  Administered 2017-12-22: 2 via ORAL
  Filled 2017-12-22: qty 2

## 2017-12-22 MED ORDER — KETOROLAC TROMETHAMINE 30 MG/ML IJ SOLN
30.0000 mg | Freq: Once | INTRAMUSCULAR | Status: AC
  Administered 2017-12-22: 30 mg via INTRAVENOUS
  Filled 2017-12-22: qty 1

## 2017-12-22 MED ORDER — SULFAMETHOXAZOLE-TRIMETHOPRIM 800-160 MG PO TABS: 1.0000 | ORAL_TABLET | Freq: Two times a day (BID) | ORAL | 0 refills | Status: DC

## 2017-12-22 MED ORDER — SODIUM CHLORIDE 0.9 % IV BOLUS (SEPSIS)
1000.0000 mL | Freq: Once | INTRAVENOUS | Status: AC
  Administered 2017-12-22: 1000 mL via INTRAVENOUS

## 2017-12-22 MED ORDER — HYDROCODONE-ACETAMINOPHEN 5-325 MG PO TABS: 1.0000 | ORAL_TABLET | Freq: Four times a day (QID) | ORAL | 0 refills | Status: DC | PRN

## 2017-12-22 MED ORDER — SULFAMETHOXAZOLE-TRIMETHOPRIM 800-160 MG PO TABS
1.0000 | ORAL_TABLET | Freq: Once | ORAL | Status: AC
Start: 2017-12-22 — End: 2017-12-22
  Administered 2017-12-22: 1 via ORAL
  Filled 2017-12-22: qty 1

## 2017-12-22 NOTE — ED Provider Notes (Signed)
[facility]-EMERGENCY DEPT Provider Note   CSN: [id number] Arrival date & time: 12/21/17  2342     History   Chief Complaint Chief Complaint  Patient presents with  . Abscess  . Tachycardia    HPI Tracy Weaver is a 31 y.o. female with PMH/o HTN Zentz for evaluation of abscess to the right buttock times 3 days.  Patient reports that initially, she noticed a boil.  She states this at home she tried using warm compresses to help drainage but did not have any improvement in symptoms.  Patient reports today that the symptoms has significantly worsened.  She reports the pain is worsened with movement or sitting down on the area.  Patient states that she did not actually measure a fever but felt subjectively febrile and chills.  Patient reports that she has had abscesses to those areas before.  Patient denies any chest pain, difficulty breathing, abdominal pain, dysuria, hematuria.  The history is provided by the patient.    Past Medical History:  Diagnosis Date  . Gallbladder disease   . Hypertension     Patient Active Problem List   Diagnosis Date Noted  . Gallstones with obstruction of gallbladder 09/27/2013  . Fatty liver disease, nonalcoholic 09/27/2013  . Obesity 09/27/2013  . Gallstones without obstruction of gallbladder 09/27/2013    History reviewed. No pertinent surgical history.  OB History    No data available       Home Medications    Prior to Admission medications   Medication Sig Start Date End Date Taking? Authorizing Provider  HYDROcodone-acetaminophen (NORCO/VICODIN) 5-325 MG tablet Take 1-2 tablets by mouth every 6 (six) hours as needed. 12/22/17   Maxwell CaulLayden, Frank Pilger A, PA-C  sulfamethoxazole-trimethoprim (BACTRIM DS,SEPTRA DS) 800-160 MG tablet Take 1 tablet by mouth 2 (two) times daily for 7 days. 12/22/17 12/29/17  Maxwell CaulLayden, Courtland Coppa A, PA-C    Family History History reviewed. No pertinent family history.  Social History Social  History   Tobacco Use  . Smoking status: Former Smoker    Packs/day: 0.50    Types: Cigarettes    Last attempt to quit: 03/19/2015    Years since quitting: 2.7  . Smokeless tobacco: Never Used  Substance Use Topics  . Alcohol use: No    Comment: occ  . Drug use: No     Allergies   Penicillins   Review of Systems Review of Systems  Constitutional: Positive for fever (subjective).  Respiratory: Negative for shortness of breath.   Cardiovascular: Negative for chest pain.  Gastrointestinal: Negative for abdominal pain, nausea and vomiting.  Genitourinary: Negative for dysuria and hematuria.  Skin: Positive for wound.     Physical Exam Updated Vital Signs BP 133/87   Pulse (!) 109   Temp (S) 98.3 F (36.8 C) (Oral)   Resp 18   LMP 11/14/2017   SpO2 100%   Physical Exam  Constitutional: She appears well-developed and well-nourished.  HENT:  Head: Normocephalic and atraumatic.  Eyes: Conjunctivae and EOM are normal. Right eye exhibits no discharge. Left eye exhibits no discharge. No scleral icterus.  Pulmonary/Chest: Effort normal.  Genitourinary: Rectal exam shows no internal hemorrhoid, no mass and no tenderness.     Genitourinary Comments: The exam was performed with a chaperone present.  Evidence of induration, warmth, erythema with central fluctuance noted to the right perianal region.  It does not extend down into the anus.  Digital rectal exam reveals no mass, fluctuance, no tenderness.  Neurological: She  is alert.  Skin: Skin is warm and dry.  Psychiatric: Her speech is normal and behavior is normal. Her mood appears anxious.  Very anxious  Nursing note and vitals reviewed.    ED Treatments / Results  Labs (all labs ordered are listed, but only abnormal results are displayed) Labs Reviewed  I-STAT BETA HCG BLOOD, ED ([facility], [facility], [facility] ONLY)    EKG  EKG Interpretation  Date/Time:  Sunday December 21 2017 23:52:43 EST Ventricular Rate:  133 PR  Interval:    QRS Duration: 74 QT Interval:  285 QTC Calculation: 424 R Axis:   86 Text Interpretation:  Sinus tachycardia Atrial premature complex Borderline T abnormalities, inferior leads Baseline wander in lead(s) V1 V3 V4 V5 V6 No STEMI.  Confirmed by Long, Joshua ([id number]) on 12/22/2017 12:18:10 PM       Radiology No results found.  Procedures ..Incision and Drainage Date/Time: 12/22/2017 4:21 AM Performed by: Michayla Mcneil A, PA-C Authorized by: Viviann Broyles A, PA-C   Consent:    Consent obtained:  Verbal   Consent given by:  Patient Location:    Type:  Abscess   Location:  Anogenital   Anogenital location:  Perianal Pre-procedure details:    Skin preparation:  Betadine Anesthesia (see MAR for exact dosages):    Anesthesia method:  Local infiltration   Local anesthetic:  Lidocaine 1% w/o epi Procedure type:    Complexity:  Simple Procedure details:    Needle aspiration: yes     Needle size:  22 G   Incision types:  Single straight   Scalpel blade:  11   Wound management:  Irrigated with saline and extensive cleaning   Drainage:  Purulent   Drainage amount:  Moderate   Wound treatment:  Drain placed   Packing materials:  1/4 in gauze Post-procedure details:    Patient tolerance of procedure:  Tolerated well, no immediate complications   (including critical care time)  Medications Ordered in ED Medications  lidocaine (PF) (XYLOCAINE) 1 % injection (  Given by Other 12/22/17 0301)  sodium chloride 0.9 % bolus 1,000 mL (0 mLs Intravenous Stopped 12/22/17 0300)  ketorolac (TORADOL) 30 MG/ML injection 30 mg (30 mg Intravenous Given 12/22/17 0255)  HYDROcodone-acetaminophen (NORCO/VICODIN) 5-325 MG per tablet 2 tablet (2 tablets Oral Given 12/22/17 0417)  sulfamethoxazole-trimethoprim (BACTRIM DS,SEPTRA DS) 800-160 MG per tablet 1 tablet (1 tablet Oral Given 12/22/17 0421)     Initial Impression / Assessment and Plan / ED Course  I have reviewed the triage  vital signs and the nursing notes.  Pertinent labs & imaging results that were available during my care of the patient were reviewed by me and considered in my medical decision making (see chart for details).     31  year old female presents for evaluation of 3 days of right buttock abscess.  Patient reports that initially, area was a small bump.  She attempted warm compresses with no improvement.  Patient reports feeling subjective febrile over the last couple days.  Pain is worse with sitting.  On ED arrival, patient is afebrile.  Patient is tachycardic but otherwise normotensive. She is very anxious. Suspect that tachycardia is secondary to pain and anxiety. Will reassess. Will give IVF for fluid resuscitation.  No indications for sepsis workup at this time.  Physical exam shows a perianal abscess on the right buttock.  It does not extend into the anal area.  Digital rectal exam reveals no mass, fluctuance, tenderness.  History/physical exam is not concerning for  rectal abscess.  Given findings, will plan to I&D in the department. Analgesics provided in the department.  ID as documented above.  There was moderate amount of purulent drainage.  The wound was thoroughly explored and irrigated.  Drain was placed for further symptomatic relief.  Will start patient on antibiotics.  Patient has an allergy to penicillins.  Will give first dose of antibiotics here in the department. Plan for additional analgesics for pain relief.   Vitals reviewed. Patient's heart rate has continue to improve after treating pain though she is still slightly tachycardic. Repeat temp shows patient is afebrile and normotensive. Patient is stable, though still anxious. Given overall well appearance, suspect that tachycardic is from continue anxiety. Patient stable for discharge at this time. Patient advised wound care instructions.  Instructed patient to return to the ED in 2 days for wound recheck. Patient had ample opportunity for  questions and discussion. All patient's questions were answered with full understanding. Strict return precautions discussed. Patient expresses understanding and agreement to plan.    Final Clinical Impressions(s) / ED Diagnoses   Final diagnoses:  Abscess of buttock, right    ED Discharge Orders        Ordered    sulfamethoxazole-trimethoprim (BACTRIM DS,SEPTRA DS) 800-160 MG tablet  2 times daily     12/22/17 0459    HYDROcodone-acetaminophen (NORCO/VICODIN) 5-325 MG tablet  Every 6 hours PRN     12/22/17 0459       Maxwell CaulLayden, Renard Caperton A, PA-C 12/22/17 1524    Geoffery Lyonselo, Douglas, MD 12/30/17 2257

## 2017-12-22 NOTE — Discharge Instructions (Signed)
Apply warm compresses to the area to help continue express drainage.   Keep the wound clean and dry. Gently wash the wound with soap and water and make sure to pat it dry.   Take antibiotic and complete the entire course.   You can take Tylenol or Ibuprofen as directed for pain.  Followup with the ED   in 2 days for wound recheck and packing removal.   Return to the Emergency Department if you experienced any worsening/spreading redness or swelling, fever, worsening pain, or any other worsening or concerning symptoms.

## 2017-12-24 DIAGNOSIS — L0231 Cutaneous abscess of buttock: Secondary | ICD-10-CM | POA: Insufficient documentation

## 2017-12-24 DIAGNOSIS — I10 Essential (primary) hypertension: Secondary | ICD-10-CM | POA: Insufficient documentation

## 2017-12-24 DIAGNOSIS — Z87891 Personal history of nicotine dependence: Secondary | ICD-10-CM | POA: Insufficient documentation

## 2017-12-25 ENCOUNTER — Encounter (HOSPITAL_COMMUNITY): Payer: Self-pay | Admitting: Emergency Medicine

## 2017-12-25 ENCOUNTER — Emergency Department (HOSPITAL_COMMUNITY)
Admission: EM | Admit: 2017-12-25 | Discharge: 2017-12-25 | Disposition: A | Discharge status: Home / Self Care | Payer: Self-pay | Attending: Emergency Medicine | Admitting: Emergency Medicine

## 2017-12-25 DIAGNOSIS — L0291 Cutaneous abscess, unspecified: Secondary | ICD-10-CM

## 2017-12-25 HISTORY — DX: Cutaneous abscess, unspecified: L02.91

## 2017-12-25 NOTE — ED Provider Notes (Signed)
[facility] Provider Note   CSN: [id number] Arrival date & time: 12/24/17  2356     History   Chief Complaint No chief complaint on file.   HPI Tracy Weaver is a 32 y.o. female.  The history is provided by the patient.  Abscess  Abscess quality: induration   Progression:  Improving Chronicity:  New Context: not diabetes   Relieved by:  Nothing Worsened by:  Nothing Associated symptoms: no fever   Patient presents with a wound recheck of right buttock abscess She reports she had this drained on December 31, it is now improving and she is requesting removal of packing Denies fever, denies vomiting, reports her pain is improved  Past Medical History:  Diagnosis Date  . Abscess   . Gallbladder disease   . Hypertension     Patient Active Problem List   Diagnosis Date Noted  . Gallstones with obstruction of gallbladder 09/27/2013  . Fatty liver disease, nonalcoholic 09/27/2013  . Obesity 09/27/2013  . Gallstones without obstruction of gallbladder 09/27/2013    History reviewed. No pertinent surgical history.  OB History    No data available       Home Medications    Prior to Admission medications   Medication Sig Start Date End Date Taking? Authorizing Provider  HYDROcodone-acetaminophen (NORCO/VICODIN) 5-325 MG tablet Take 1-2 tablets by mouth every 6 (six) hours as needed. 12/22/17   Maxwell CaulLayden, Lindsey A, PA-C  sulfamethoxazole-trimethoprim (BACTRIM DS,SEPTRA DS) 800-160 MG tablet Take 1 tablet by mouth 2 (two) times daily for 7 days. 12/22/17 12/29/17  Maxwell CaulLayden, Lindsey A, PA-C    Family History No family history on file.  Social History Social History   Tobacco Use  . Smoking status: Former Smoker    Packs/day: 0.50    Types: Cigarettes    Last attempt to quit: 03/19/2015    Years since quitting: 2.7  . Smokeless tobacco: Never Used  Substance Use Topics  . Alcohol use: No    Comment: occ  . Drug use: No      Allergies   Penicillins   Review of Systems Review of Systems  Constitutional: Negative for fever.  Skin: Positive for wound.     Physical Exam Updated Vital Signs BP (!) 153/105 (BP Location: Right Arm)   Pulse 99   Temp 98.6 F (37 C) (Oral)   Resp 17   SpO2 98%   Physical Exam CONSTITUTIONAL: Well developed/well nourished HEAD: Normocephalic/atraumatic ENMT: Mucous membranes moist NECK: supple no meningeal signs LUNGS:   no apparent distress Rectal - well healing abscess noted, no fluctuance.  Packing noted.  No erythema.  No crepitus.  Nurse chaperone present for exam NEURO: Pt is awake/alert/appropriate, moves all extremitiesx4.  No facial droop.   EXTREMITIES:  full ROM SKIN: warm, color normal PSYCH: no abnormalities of mood noted, alert and oriented to situation   ED Treatments / Results  Labs (all labs ordered are listed, but only abnormal results are displayed) Labs Reviewed - No data to display  EKG  EKG Interpretation None       Radiology No results found.  Procedures Procedures (including critical care time)  Medications Ordered in ED Medications - No data to display   Initial Impression / Assessment and Plan / ED Course  I have reviewed the triage vital signs and the nursing notes.   I removed the packing without any difficulty, patient appears to be healing well I encouraged her to continue using her  antibiotic  Final Clinical Impressions(s) / ED Diagnoses   Final diagnoses:  Abscess    ED Discharge Orders    None       Zadie Rhine, MD 12/25/17 [phone]

## 2017-12-25 NOTE — ED Notes (Signed)
ED Provider at bedside. 

## 2017-12-25 NOTE — ED Notes (Signed)
Bed: WA01 Expected date:  Expected time:  Means of arrival:  Comments: 

## 2017-12-25 NOTE — ED Triage Notes (Signed)
Pt comes in after having an abscess I&D'd on 12/31 for a wound check.  Wound on buttocks and has packing that was placed on day of I&D.  Pt states she feels much better and feels like it is healing well.  Does state some of the packing has come out of the wound as expected.  Afebrile on assessment.

## 2017-12-26 ENCOUNTER — Emergency Department (HOSPITAL_COMMUNITY): Payer: Self-pay

## 2017-12-26 ENCOUNTER — Inpatient Hospital Stay (HOSPITAL_COMMUNITY)
Admission: EM | Admit: 2017-12-26 | Discharge: 2017-12-29 | DRG: 872 | Disposition: A | Discharge status: Home / Self Care | Payer: Self-pay | Attending: Surgery | Admitting: Surgery

## 2017-12-26 ENCOUNTER — Other Ambulatory Visit: Payer: Self-pay

## 2017-12-26 ENCOUNTER — Encounter (HOSPITAL_COMMUNITY): Payer: Self-pay

## 2017-12-26 DIAGNOSIS — L0291 Cutaneous abscess, unspecified: Secondary | ICD-10-CM

## 2017-12-26 DIAGNOSIS — I10 Essential (primary) hypertension: Secondary | ICD-10-CM | POA: Diagnosis present

## 2017-12-26 DIAGNOSIS — Z6834 Body mass index (BMI) 34.0-34.9, adult: Secondary | ICD-10-CM

## 2017-12-26 DIAGNOSIS — E119 Type 2 diabetes mellitus without complications: Secondary | ICD-10-CM | POA: Diagnosis present

## 2017-12-26 DIAGNOSIS — Z880 Allergy status to penicillin: Secondary | ICD-10-CM

## 2017-12-26 DIAGNOSIS — L03115 Cellulitis of right lower limb: Secondary | ICD-10-CM

## 2017-12-26 DIAGNOSIS — A419 Sepsis, unspecified organism: Principal | ICD-10-CM

## 2017-12-26 DIAGNOSIS — E872 Acidosis: Secondary | ICD-10-CM | POA: Diagnosis present

## 2017-12-26 DIAGNOSIS — L0231 Cutaneous abscess of buttock: Secondary | ICD-10-CM | POA: Diagnosis present

## 2017-12-26 DIAGNOSIS — Z87891 Personal history of nicotine dependence: Secondary | ICD-10-CM

## 2017-12-26 LAB — URINALYSIS, ROUTINE W REFLEX MICROSCOPIC
BILIRUBIN URINE: NEGATIVE
HGB URINE DIPSTICK: NEGATIVE
KETONES UR: 80 mg/dL — AB
Nitrite: NEGATIVE
PH: 5 (ref 5.0–8.0)
Protein, ur: 30 mg/dL — AB
Specific Gravity, Urine: 1.035 — ABNORMAL HIGH (ref 1.005–1.030)

## 2017-12-26 LAB — CBC WITH DIFFERENTIAL/PLATELET
BASOS PCT: 0 %
Basophils Absolute: 0 10*3/uL (ref 0.0–0.1)
EOS ABS: 0 10*3/uL (ref 0.0–0.7)
EOS PCT: 0 %
HCT: 40.2 % (ref 36.0–46.0)
Hemoglobin: 13.5 g/dL (ref 12.0–15.0)
Lymphocytes Relative: 7 %
Lymphs Abs: 1.6 10*3/uL (ref 0.7–4.0)
MCH: 28.7 pg (ref 26.0–34.0)
MCHC: 33.6 g/dL (ref 30.0–36.0)
MCV: 85.5 fL (ref 78.0–100.0)
MONO ABS: 1.1 10*3/uL — AB (ref 0.1–1.0)
Monocytes Relative: 5 %
NEUTROS ABS: 19.9 10*3/uL — AB (ref 1.7–7.7)
NEUTROS PCT: 88 %
PLATELETS: 411 10*3/uL — AB (ref 150–400)
RBC: 4.7 MIL/uL (ref 3.87–5.11)
RDW: 12.7 % (ref 11.5–15.5)
WBC: 22.6 10*3/uL — ABNORMAL HIGH (ref 4.0–10.5)

## 2017-12-26 LAB — LACTIC ACID, PLASMA: Lactic Acid, Venous: 2.2 mmol/L (ref 0.5–1.9)

## 2017-12-26 LAB — COMPREHENSIVE METABOLIC PANEL
ALT: 23 U/L (ref 14–54)
AST: 23 U/L (ref 15–41)
Albumin: 3.9 g/dL (ref 3.5–5.0)
Alkaline Phosphatase: 115 U/L (ref 38–126)
Anion gap: 14 (ref 5–15)
BILIRUBIN TOTAL: 1.1 mg/dL (ref 0.3–1.2)
BUN: 12 mg/dL (ref 6–20)
CHLORIDE: 96 mmol/L — AB (ref 101–111)
CO2: 20 mmol/L — ABNORMAL LOW (ref 22–32)
Calcium: 9.8 mg/dL (ref 8.9–10.3)
Creatinine, Ser: 0.76 mg/dL (ref 0.44–1.00)
GFR calc Af Amer: >60 mL/min (ref >60)
GLUCOSE: 351 mg/dL — AB (ref 65–99)
Potassium: 3.8 mmol/L (ref 3.5–5.1)
Sodium: 130 mmol/L — ABNORMAL LOW (ref 135–145)
TOTAL PROTEIN: 8.3 g/dL — AB (ref 6.5–8.1)

## 2017-12-26 LAB — RAPID URINE DRUG SCREEN, HOSP PERFORMED
Amphetamines: NOT DETECTED
Barbiturates: NOT DETECTED
Benzodiazepines: NOT DETECTED
Cocaine: NOT DETECTED
OPIATES: NOT DETECTED
Tetrahydrocannabinol: NOT DETECTED

## 2017-12-26 LAB — PREGNANCY, URINE: Preg Test, Ur: NEGATIVE

## 2017-12-26 LAB — I-STAT BETA HCG BLOOD, ED (MC, WL, AP ONLY)
I-stat hCG, quantitative: 19.5 m[IU]/mL — ABNORMAL HIGH (ref <5)
I-stat hCG, quantitative: 12.3 m[IU]/mL — ABNORMAL HIGH (ref <5)

## 2017-12-26 LAB — HEMOGLOBIN A1C
HEMOGLOBIN A1C: 10.5 % — AB (ref 4.8–5.6)
Mean Plasma Glucose: 254.65 mg/dL

## 2017-12-26 LAB — I-STAT CG4 LACTIC ACID, ED
LACTIC ACID, VENOUS: 2.04 mmol/L — AB (ref 0.5–1.9)
LACTIC ACID, VENOUS: 2.69 mmol/L — AB (ref 0.5–1.9)

## 2017-12-26 MED ORDER — SODIUM CHLORIDE 0.9 % IV BOLUS (SEPSIS)
1000.0000 mL | Freq: Once | INTRAVENOUS | Status: AC
  Administered 2017-12-26: 1000 mL via INTRAVENOUS

## 2017-12-26 MED ORDER — ONDANSETRON HCL 4 MG/2ML IJ SOLN
4.0000 mg | Freq: Once | INTRAMUSCULAR | Status: AC
  Administered 2017-12-26: 4 mg via INTRAVENOUS
  Filled 2017-12-26: qty 2

## 2017-12-26 MED ORDER — DEXTROSE 5 % IV SOLN
2.0000 g | Freq: Once | INTRAVENOUS | Status: AC
  Administered 2017-12-26: 2 g via INTRAVENOUS
  Filled 2017-12-26: qty 2

## 2017-12-26 MED ORDER — MORPHINE SULFATE (PF) 4 MG/ML IV SOLN
4.0000 mg | Freq: Once | INTRAVENOUS | Status: AC
  Administered 2017-12-26: 4 mg via INTRAVENOUS
  Filled 2017-12-26: qty 1

## 2017-12-26 MED ORDER — DEXTROSE 5 % IV SOLN
1.0000 g | Freq: Three times a day (TID) | INTRAVENOUS | Status: DC
  Administered 2017-12-27 – 2017-12-29 (×7): 1 g via INTRAVENOUS
  Filled 2017-12-26 (×9): qty 1

## 2017-12-26 MED ORDER — ACETAMINOPHEN 325 MG PO TABS
650.0000 mg | ORAL_TABLET | Freq: Once | ORAL | Status: AC
  Administered 2017-12-26: 650 mg via ORAL
  Filled 2017-12-26: qty 2

## 2017-12-26 MED ORDER — VANCOMYCIN HCL IN DEXTROSE 1-5 GM/200ML-% IV SOLN
1000.0000 mg | Freq: Three times a day (TID) | INTRAVENOUS | Status: DC
  Administered 2017-12-27 – 2017-12-28 (×5): 1000 mg via INTRAVENOUS
  Filled 2017-12-26 (×7): qty 200

## 2017-12-26 MED ORDER — VANCOMYCIN HCL 10 G IV SOLR
2000.0000 mg | Freq: Once | INTRAVENOUS | Status: AC
  Administered 2017-12-26: 2000 mg via INTRAVENOUS
  Filled 2017-12-26: qty 2000

## 2017-12-26 MED ORDER — IOPAMIDOL (ISOVUE-300) INJECTION 61%
INTRAVENOUS | Status: AC
  Administered 2017-12-26: 100 mL
  Filled 2017-12-26: qty 100

## 2017-12-26 NOTE — H&P (Signed)
Tracy Weaver is an 32 y.o. female.   Chief Complaint: right buttock abscess HPI: this is a 32 year old female presents with a worsening right buttock abscess. She had an incision and drainage performed on December 31 was a long hospital in the emergency department. She returned 2 days ago for packing removal. Since then, she is to have fever and also developed nausea and vomiting and increasing discomfort. Surgery has been asked to see. She has now had spontaneous drainage of a large amount of purulence from the previous incision and drainage site. She is still having fairly severe discomfort. She was initially found to have an elevated white blood count lactic acid level.  Past Medical History:  Diagnosis Date  . Abscess   . Gallbladder disease   . Hypertension     History reviewed. No pertinent surgical history.  History reviewed. No pertinent family history. Social History:  reports that she quit smoking about 2 years ago. Her smoking use included cigarettes. She smoked 0.50 packs per day. she has never used smokeless tobacco. She reports that she does not drink alcohol or use drugs.  Allergies:  Allergies  Allergen Reactions  . Penicillins Swelling    Childhood reaction Has patient had a PCN reaction causing immediate rash, facial/tongue/throat swelling, SOB or lightheadedness with hypotension: yes Has patient had a PCN reaction causing severe rash involving mucus membranes or skin necrosis: no Has patient had a PCN reaction that required hospitalization: no Has patient had a PCN reaction occurring within the last 10 years: no If all of the above answers are "NO", then may proceed with Cephalosporin use.      (Not in a hospital admission)  Results for orders placed or performed during the hospital encounter of 12/26/17 (from the past 48 hour(s))  Comprehensive metabolic panel     Status: Abnormal   Collection Time: 12/26/17  5:40 PM  Result Value Ref Range   Sodium 130  (L) 135 - 145 mmol/L   Potassium 3.8 3.5 - 5.1 mmol/L   Chloride 96 (L) 101 - 111 mmol/L   CO2 20 (L) 22 - 32 mmol/L   Glucose, Bld 351 (H) 65 - 99 mg/dL   BUN 12 6 - 20 mg/dL   Creatinine, Ser 0.76 0.44 - 1.00 mg/dL   Calcium 9.8 8.9 - 10.3 mg/dL   Total Protein 8.3 (H) 6.5 - 8.1 g/dL   Albumin 3.9 3.5 - 5.0 g/dL   AST 23 15 - 41 U/L   ALT 23 14 - 54 U/L   Alkaline Phosphatase 115 38 - 126 U/L   Total Bilirubin 1.1 0.3 - 1.2 mg/dL   GFR calc non Af Amer >60 >60 mL/min   GFR calc Af Amer >60 >60 mL/min    Comment: (NOTE) The eGFR has been calculated using the CKD EPI equation. This calculation has not been validated in all clinical situations. eGFR's persistently <60 mL/min signify possible Chronic Kidney Disease.    Anion gap 14 5 - 15  CBC with Differential     Status: Abnormal   Collection Time: 12/26/17  5:40 PM  Result Value Ref Range   WBC 22.6 (H) 4.0 - 10.5 K/uL   RBC 4.70 3.87 - 5.11 MIL/uL   Hemoglobin 13.5 12.0 - 15.0 g/dL   HCT 40.2 36.0 - 46.0 %   MCV 85.5 78.0 - 100.0 fL   MCH 28.7 26.0 - 34.0 pg   MCHC 33.6 30.0 - 36.0 g/dL   RDW 12.7 11.5 -  15.5 %   Platelets 411 (H) 150 - 400 K/uL   Neutrophils Relative % 88 %   Lymphocytes Relative 7 %   Monocytes Relative 5 %   Eosinophils Relative 0 %   Basophils Relative 0 %   Neutro Abs 19.9 (H) 1.7 - 7.7 K/uL   Lymphs Abs 1.6 0.7 - 4.0 K/uL   Monocytes Absolute 1.1 (H) 0.1 - 1.0 K/uL   Eosinophils Absolute 0.0 0.0 - 0.7 K/uL   Basophils Absolute 0.0 0.0 - 0.1 K/uL   WBC Morphology FEW NEUTROPHIL BANDS NOTED   Hemoglobin A1c     Status: Abnormal   Collection Time: 12/26/17  5:40 PM  Result Value Ref Range   Hgb A1c MFr Bld 10.5 (H) 4.8 - 5.6 %    Comment: (NOTE) Pre diabetes:          5.7%-6.4% Diabetes:              >6.4% Glycemic control for   <7.0% adults with diabetes    Mean Plasma Glucose 254.65 mg/dL  I-Stat Beta hCG blood, ED ([facility], [facility], [facility] only)     Status: Abnormal   Collection Time: 12/26/17   5:50 PM  Result Value Ref Range   I-stat hCG, quantitative 19.5 (H) <5 mIU/mL   Comment 3            Comment:   GEST. AGE      CONC.  (mIU/mL)   <=1 WEEK        5 - 50     2 WEEKS       50 - 500     3 WEEKS       100 - 10,000     4 WEEKS     1,000 - 30,000        FEMALE AND NON-PREGNANT FEMALE:     LESS THAN 5 mIU/mL   I-Stat CG4 Lactic Acid, ED     Status: Abnormal   Collection Time: 12/26/17  5:52 PM  Result Value Ref Range   Lactic Acid, Venous 2.04 (HH) 0.5 - 1.9 mmol/L   Comment NOTIFIED PHYSICIAN   Urinalysis, Routine w reflex microscopic     Status: Abnormal   Collection Time: 12/26/17  7:20 PM  Result Value Ref Range   Color, Urine AMBER (A) YELLOW    Comment: BIOCHEMICALS MAY BE AFFECTED BY COLOR   APPearance CLOUDY (A) CLEAR   Specific Gravity, Urine 1.035 (H) 1.005 - 1.030   pH 5.0 5.0 - 8.0   Glucose, UA >=500 (A) NEGATIVE mg/dL   Hgb urine dipstick NEGATIVE NEGATIVE   Bilirubin Urine NEGATIVE NEGATIVE   Ketones, ur 80 (A) NEGATIVE mg/dL   Protein, ur 30 (A) NEGATIVE mg/dL   Nitrite NEGATIVE NEGATIVE   Leukocytes, UA MODERATE (A) NEGATIVE   RBC / HPF 0-5 0 - 5 RBC/hpf   WBC, UA 6-30 0 - 5 WBC/hpf   Bacteria, UA FEW (A) NONE SEEN   Squamous Epithelial / LPF 6-30 (A) NONE SEEN   Mucus PRESENT    Hyaline Casts, UA PRESENT   Rapid urine drug screen (hospital performed)     Status: None   Collection Time: 12/26/17  7:20 PM  Result Value Ref Range   Opiates NONE DETECTED NONE DETECTED   Cocaine NONE DETECTED NONE DETECTED   Benzodiazepines NONE DETECTED NONE DETECTED   Amphetamines NONE DETECTED NONE DETECTED   Tetrahydrocannabinol NONE DETECTED NONE DETECTED   Barbiturates NONE DETECTED NONE DETECTED  Comment: (NOTE) DRUG SCREEN FOR MEDICAL PURPOSES ONLY.  IF CONFIRMATION IS NEEDED FOR ANY PURPOSE, NOTIFY LAB WITHIN 5 DAYS. LOWEST DETECTABLE LIMITS FOR URINE DRUG SCREEN Drug Class                     Cutoff (ng/mL) Amphetamine and metabolites     1000 Barbiturate and metabolites    200 Benzodiazepine                 650 Tricyclics and metabolites     300 Opiates and metabolites        300 Cocaine and metabolites        300 THC                            50   I-Stat CG4 Lactic Acid, ED     Status: Abnormal   Collection Time: 12/26/17  7:48 PM  Result Value Ref Range   Lactic Acid, Venous 2.69 (HH) 0.5 - 1.9 mmol/L   Comment NOTIFIED PHYSICIAN   I-Stat beta hCG blood, ED ([facility], [facility], [facility] only)     Status: Abnormal   Collection Time: 12/26/17  7:49 PM  Result Value Ref Range   I-stat hCG, quantitative 12.3 (H) <5 mIU/mL   Comment 3            Comment:   GEST. AGE      CONC.  (mIU/mL)   <=1 WEEK        5 - 50     2 WEEKS       50 - 500     3 WEEKS       100 - 10,000     4 WEEKS     1,000 - 30,000        FEMALE AND NON-PREGNANT FEMALE:     LESS THAN 5 mIU/mL   Pregnancy, urine     Status: None   Collection Time: 12/26/17  7:50 PM  Result Value Ref Range   Preg Test, Ur NEGATIVE NEGATIVE  Lactic acid, plasma     Status: Abnormal   Collection Time: 12/26/17  8:11 PM  Result Value Ref Range   Lactic Acid, Venous 2.2 (HH) 0.5 - 1.9 mmol/L    Comment: CRITICAL RESULT CALLED TO, READ BACK BY AND VERIFIED WITH: CLEMMER C,RN 12/26/17 2201 WAYK    Ct Pelvis W Contrast  Result Date: 12/26/2017 CLINICAL DATA:  Abscess in the buttocks. Severe pain and swelling. Bubble well on buttocks was drained on 12/22/2017. Packing was removed in ED yesterday. Some packing remains. EXAM: CT PELVIS WITH CONTRAST TECHNIQUE: Multidetector CT imaging of the pelvis was performed using the standard protocol following the bolus administration of intravenous contrast. CONTRAST:  123m ISOVUE-300 IOPAMIDOL (ISOVUE-300) INJECTION 61% COMPARISON:  None. FINDINGS: Urinary Tract: Bladder wall is not thickened. No bladder filling defects. Distal ureters are decompressed. Bowel: Scattered gas and stool in the visualized colon. No dilatation of visualized small or large  bowel. Appendix is normal. Vascular/Lymphatic: Prominent enhancing lymph nodes in the right groin are likely reactive. No pathologically enlarged lymph nodes. No significant vascular abnormality seen. Reproductive: Exophytic Iso enhancing lesion arising from the posterior uterine fundus likely representing an exophytic fibroid. No abnormal adnexal masses. Other:  No free air or free fluid demonstrated in the pelvis. There is extensive soft tissue infiltration and skin thickening along the medial aspect of the right upper thigh, extending into the perineal  region. Multiple small gas collections are demonstrated within the indurated fat. Changes are consistent with cellulitis due gas-forming organism. No discrete loculated fluid collection is identified. Inflammatory changes extend just to the inferior right anal margins. Musculoskeletal: No suspicious bone lesions identified. IMPRESSION: 1. Extensive soft tissue infiltration and skin thickening along the medial aspect of the right upper thigh and extending to the perineal region to the base of the right anal rim. Soft tissue gas collections are present consistent with cellulitis due to gas-forming organism. No discrete loculated fluid collection. 2. Small fibroid crash that small exophytic fibroid on the uterus. These results were called by telephone at the time of interpretation on 12/26/2017 at 10:23 pm to PA. MIA MCDONALD , who verbally acknowledged these results. Electronically Signed   By: Lucienne Capers M.D.   On: 12/26/2017 22:26    Review of Systems  Constitutional: Positive for fever.  Respiratory: Negative for shortness of breath.   Cardiovascular: Negative for chest pain.  Gastrointestinal: Positive for nausea and vomiting.    Blood pressure 136/90, pulse (!) 111, temperature 98.2 F (36.8 C), temperature source Oral, resp. rate 18, height _0  (1.727 m), weight 104.3 kg (230 lb), last menstrual period 10/23/2017, SpO2 99 %. Physical Exam   Constitutional: She is oriented to person, place, and time. She appears well-developed and well-nourished. She appears distressed.  HENT:  Head: Normocephalic and atraumatic.  Right Ear: External ear normal.  Left Ear: External ear normal.  Mouth/Throat: No oropharyngeal exudate.  Eyes: Pupils are equal, round, and reactive to light. Right eye exhibits no discharge. Left eye exhibits no discharge.  Neck: Normal range of motion. No tracheal deviation present.  Cardiovascular: Normal heart sounds.  Tachycardic with regular rhythm  Respiratory: Effort normal and breath sounds normal. No respiratory distress.  GI: Soft. There is no tenderness.  Musculoskeletal: Normal range of motion. She exhibits no deformity.  Neurological: She is alert and oriented to person, place, and time.  Skin: She is not diaphoretic.  There is an abscess on her right buttock near the inner thigh which is draining a large medical purulence. There is some induration but no crepitance and only mild erythema     Assessment/Plan Large right buttock abscess  It is at least now spontaneously draining. On physical examination I do not find any crepitus or other findings to suggest necrotizing fasciitis. I believe this will need to be opened further in the operating room in the morning for adequate packing of the wound. She is currently being admitted for IV antibiotics and pain control.  Harl Bowie, MD 12/26/2017, 11:30 PM

## 2017-12-26 NOTE — ED Notes (Signed)
Pt left for CT.

## 2017-12-26 NOTE — H&P (Addendum)
Addendum this is just for documentation purposes as was initially paged to admit the patient from the ED, and told general surgery would consult.  However, after placing some initial orders, and further review of the chart it appeared Surgery was admitting. Therefore paged Dr. Magnus Ivan to verify if [facility] was the admitting, and was told no need to see the patient.  Some orders such as D5 IV fluids were discontinued due to elevated blood sugars 391 without anion gap and hemoglobin A1c noted to be 10.5. Patient appears to be a newly diagnosed diabetic, and had started sensitive SSI  every 4 hours.   History and Physical    Tracy Weaver ZOX:[id number] DOB: 1986-01-29 DOA: 12/26/2017  Referring MD/NP/PA: Elpidio Anis, PA-C PCP: Patient, No Pcp Per  Patient coming from:  home   Chief Complaint:    I have personally briefly reviewed patient's old medical records in [facility]   HPI: Tracy Weaver is a 32 y.o. female with medical history significant of obesity and right buttock abscess s/p I&D 12/31; who presents with worsening right buttock pain.   ED Course: On admission into the emergency department patient was seen to be febrile up to 102.1 F, pulse 111-154, respirations 9-22, blood pressure 110/63-161/107, and saturations maintained on room air.  Labs revealed WBC 22.6, platelets 411, sodium 130, BUN 12, creatinine 0.76, glucose 351, anion gap 12, lactic acid up to 2.69, and hemoglobin A1c 10.5.  Urinalysis was CT scan of the abdomen revealed extensive soft tissue infiltration and skin thickening along the medial aspect of the perineal region consistent with cellulitis due to gas-forming organism.  No discrete loculated fluid collection noted. Sepsis protocol was initiated with 3 L of fluid given, and she eceived empiric antibiotics of cefepime and vancomycin.  Dr. Magnus Ivan of general surgery was consulted and recommended possible I&D in a.m. and [facility] admission.  Review of  Systems    Past Medical History:  Diagnosis Date  . Abscess   . Gallbladder disease   . Hypertension     History reviewed. No pertinent surgical history.   reports that she quit smoking about 2 years ago. Her smoking use included cigarettes. She smoked 0.50 packs per day. she has never used smokeless tobacco. She reports that she does not drink alcohol or use drugs.  Allergies  Allergen Reactions  . Penicillins Swelling    Childhood reaction Has patient had a PCN reaction causing immediate rash, facial/tongue/throat swelling, SOB or lightheadedness with hypotension: yes Has patient had a PCN reaction causing severe rash involving mucus membranes or skin necrosis: no Has patient had a PCN reaction that required hospitalization: no Has patient had a PCN reaction occurring within the last 10 years: no If all of the above answers are "NO", then may proceed with Cephalosporin use.     History reviewed. No pertinent family history.  Prior to Admission medications   Medication Sig Start Date End Date Taking? Authorizing Provider  sulfamethoxazole-trimethoprim (BACTRIM DS,SEPTRA DS) 800-160 MG tablet Take 1 tablet by mouth 2 (two) times daily for 7 days. Patient taking differently: Take 1 tablet by mouth 2 (two) times daily. 7 day course started 12/23/17 12/22/17 12/29/17 Yes Maxwell Caul, PA-C  HYDROcodone-acetaminophen (NORCO/VICODIN) 5-325 MG tablet Take 1-2 tablets by mouth every 6 (six) hours as needed. Patient not taking: Reported on 12/26/2017 12/22/17   Maxwell Caul, PA-C    Physical Exam:  Vitals:   12/26/17 2045 12/26/17 2100 12/26/17  2259 12/26/17 2300  BP: 112/83 120/84 136/90 120/82  Pulse: (!) 134 (!) 139 (!) 111 (!) 113  Resp: 14 (!) 22 18   Temp:   98.2 F (36.8 C)   TempSrc:   Oral   SpO2: 100% 100% 99% 99%  Weight:      Height:       Not preformed   Labs on Admission: I have personally reviewed following labs and imaging studies  CBC: Recent Labs    Lab 12/26/17 1740  WBC 22.6*  NEUTROABS 19.9*  HGB 13.5  HCT 40.2  MCV 85.5  PLT 411*   Basic Metabolic Panel: Recent Labs  Lab 12/26/17 1740  NA 130*  K 3.8  CL 96*  CO2 20*  GLUCOSE 351*  BUN 12  CREATININE 0.76  CALCIUM 9.8   GFR: Estimated Creatinine Clearance: 131.2 mL/min (by C-G formula based on SCr of 0.76 mg/dL). Liver Function Tests: Recent Labs  Lab 12/26/17 1740  AST 23  ALT 23  ALKPHOS 115  BILITOT 1.1  PROT 8.3*  ALBUMIN 3.9   No results for input(s): LIPASE, AMYLASE in the last 168 hours. No results for input(s): AMMONIA in the last 168 hours. Coagulation Profile: No results for input(s): INR, PROTIME in the last 168 hours. Cardiac Enzymes: No results for input(s): CKTOTAL, CKMB, CKMBINDEX, TROPONINI in the last 168 hours. BNP (last 3 results) No results for input(s): PROBNP in the last 8760 hours. HbA1C: Recent Labs    12/26/17 1740  HGBA1C 10.5*   CBG: No results for input(s): GLUCAP in the last 168 hours. Lipid Profile: No results for input(s): CHOL, HDL, LDLCALC, TRIG, CHOLHDL, LDLDIRECT in the last 72 hours. Thyroid Function Tests: No results for input(s): TSH, T4TOTAL, FREET4, T3FREE, THYROIDAB in the last 72 hours. Anemia Panel: No results for input(s): VITAMINB12, FOLATE, FERRITIN, TIBC, IRON, RETICCTPCT in the last 72 hours. Urine analysis:    Component Value Date/Time   COLORURINE AMBER (A) 12/26/2017 1920   APPEARANCEUR CLOUDY (A) 12/26/2017 1920   LABSPEC 1.035 (H) 12/26/2017 1920   PHURINE 5.0 12/26/2017 1920   GLUCOSEU >=500 (A) 12/26/2017 1920   HGBUR NEGATIVE 12/26/2017 1920   BILIRUBINUR NEGATIVE 12/26/2017 1920   KETONESUR 80 (A) 12/26/2017 1920   PROTEINUR 30 (A) 12/26/2017 1920   UROBILINOGEN 0.2 12/06/2013 0143   NITRITE NEGATIVE 12/26/2017 1920   LEUKOCYTESUR MODERATE (A) 12/26/2017 1920   Sepsis Labs: No results found for this or any previous visit (from the past 240 hour(s)).   Radiological Exams on  Admission: Ct Pelvis W Contrast  Result Date: 12/26/2017 CLINICAL DATA:  Abscess in the buttocks. Severe pain and swelling. Bubble well on buttocks was drained on 12/22/2017. Packing was removed in ED yesterday. Some packing remains. EXAM: CT PELVIS WITH CONTRAST TECHNIQUE: Multidetector CT imaging of the pelvis was performed using the standard protocol following the bolus administration of intravenous contrast. CONTRAST:  100mL ISOVUE-300 IOPAMIDOL (ISOVUE-300) INJECTION 61% COMPARISON:  None. FINDINGS: Urinary Tract: Bladder wall is not thickened. No bladder filling defects. Distal ureters are decompressed. Bowel: Scattered gas and stool in the visualized colon. No dilatation of visualized small or large bowel. Appendix is normal. Vascular/Lymphatic: Prominent enhancing lymph nodes in the right groin are likely reactive. No pathologically enlarged lymph nodes. No significant vascular abnormality seen. Reproductive: Exophytic Iso enhancing lesion arising from the posterior uterine fundus likely representing an exophytic fibroid. No abnormal adnexal masses. Other:  No free air or free fluid demonstrated in the pelvis. There is  extensive soft tissue infiltration and skin thickening along the medial aspect of the right upper thigh, extending into the perineal region. Multiple small gas collections are demonstrated within the indurated fat. Changes are consistent with cellulitis due gas-forming organism. No discrete loculated fluid collection is identified. Inflammatory changes extend just to the inferior right anal margins. Musculoskeletal: No suspicious bone lesions identified. IMPRESSION: 1. Extensive soft tissue infiltration and skin thickening along the medial aspect of the right upper thigh and extending to the perineal region to the base of the right anal rim. Soft tissue gas collections are present consistent with cellulitis due to gas-forming organism. No discrete loculated fluid collection. 2. Small fibroid  crash that small exophytic fibroid on the uterus. These results were called by telephone at the time of interpretation on 12/26/2017 at 10:23 pm to PA. Tracy Weaver , who verbally acknowledged these results. Electronically Signed   By: Burman Nieves M.D.   On: 12/26/2017 22:26    EKG: Independently reviewed.  Sinus tachycardia at 155 bpm  Assessment/Plan  Sepsis 2/2 Abscess of right buttock: Acute. -Admit to a telemetry bed -Follow-up blood cultures  -Continue empiric antibiotics of cefepime and vancomycin -N.p.o. for likely need of I&D in a.m. -Tylenol as needed fever -Trend lactic acid level  -Recheck CBC and coags in a.m. -Appreciate Dr. Magnus Ivan surgery consultative services, will follow-up for further recommendations   New onset diabetes: Acute.  No previous reported history and initial glucose 351 without anion gap. - Hypoglycemic protocols - CBGs every 4 hours overnight with sensitive SSI - Diabetic education consult      DVT prophylaxis: Lovenox Code Status: full  Family Communication:   Disposition Plan: TBD  Consults called: Surgery   Admission status: inpatient  Clydie Braun MD [facility] Pager [phone]   If 7PM-7AM, please contact night-coverage [email or web address] Password [facility]  12/26/2017, 11:38 PM

## 2017-12-26 NOTE — ED Provider Notes (Signed)
[facility] EM[facility]GENCY [facility] Provider Note   CSN: [id number] Arrival date & time: 12/26/17  1705     History   Chief Complaint Chief Complaint  Patient presents with  . Recurrent Skin Infections    HPI Tracy Weaver is a 32 y.o. female who presents to the emergency department with a chief complaint of fever that began yesterday.  She has not measured her fever at home.  She reports associated constant, rapidly worsening, severe pain and swelling to a wound on her right buttock over the last 24 hours that had previously been drained in the ED on 12/31.  She was seen in the ED yesterday for removal of the packing in the wound, but reports that since she was discharged from the ED that the area significantly worsened. She reports she has taken approximately 5 doses of the Bactrim over the last 3 days that she was prescribed during her visit on 12/31.  The pain is worse with sitting or putting direct pressure on the wound.  No alleviating factors.  She denies rectal pain, hematochezia, diarrhea, or N/V.   Past medical history includes gallbladder disease.  No history of DM.   The history is provided by the patient. No language interpreter was used.    Past Medical History:  Diagnosis Date  . Abscess   . Gallbladder disease   . Hypertension     Patient Active Problem List   Diagnosis Date Noted  . Gallstones with obstruction of gallbladder 09/27/2013  . Fatty liver disease, nonalcoholic 09/27/2013  . Obesity 09/27/2013  . Gallstones without obstruction of gallbladder 09/27/2013    History reviewed. No pertinent surgical history.  OB History    No data available       Home Medications    Prior to Admission medications   Medication Sig Start Date End Date Taking? Authorizing Provider  HYDROcodone-acetaminophen (NORCO/VICODIN) 5-325 MG tablet Take 1-2 tablets by mouth every 6 (six) hours as needed. 12/22/17   Maxwell CaulLayden, Lindsey A, PA-C    sulfamethoxazole-trimethoprim (BACTRIM DS,SEPTRA DS) 800-160 MG tablet Take 1 tablet by mouth 2 (two) times daily for 7 days. 12/22/17 12/29/17  Maxwell CaulLayden, Lindsey A, PA-C    Family History History reviewed. No pertinent family history.  Social History Social History   Tobacco Use  . Smoking status: Former Smoker    Packs/day: 0.50    Types: Cigarettes    Last attempt to quit: 03/19/2015    Years since quitting: 2.7  . Smokeless tobacco: Never Used  Substance Use Topics  . Alcohol use: No    Comment: occ  . Drug use: No     Allergies   Penicillins   Review of Systems Review of Systems  Constitutional: Positive for fever. Negative for activity change.  HENT: Negative for congestion.   Eyes: Negative for visual disturbance.  Respiratory: Negative for shortness of breath.   Cardiovascular: Negative for chest pain.  Gastrointestinal: Negative for abdominal pain, anal bleeding, blood in stool, diarrhea, nausea and vomiting.  Genitourinary: Positive for pelvic pain. Negative for dysuria and genital sores.  Musculoskeletal: Negative for back pain.  Skin: Positive for wound. Negative for rash.  Allergic/Immunologic: Negative for immunocompromised state.  Neurological: Negative for dizziness and light-headedness.  Psychiatric/Behavioral: Negative for confusion.     Physical Exam Updated Vital Signs BP 110/63   Pulse (!) 135   Temp (!) 102.1 F (38.9 C) (Rectal)   Resp 18   Ht 5\' 8"  (1.727 m)  Wt 104.3 kg (230 lb)   LMP 10/23/2017 (Within Days)   SpO2 99%   BMI 34.97 kg/m   Physical Exam  Constitutional: No distress.  HENT:  Head: Normocephalic.  Eyes: Conjunctivae are normal.  Neck: Neck supple.  Cardiovascular: Regular rhythm, normal heart sounds and intact distal pulses. Tachycardia present. Exam reveals no gallop and no friction rub.  No murmur heard. Pulmonary/Chest: Effort normal. No stridor. No respiratory distress. She has no wheezes. She has no rales.   Abdominal: Soft. Bowel sounds are normal. She exhibits no distension and no mass. There is no tenderness. There is no rebound and no guarding.  Genitourinary:  Genitourinary Comments: Chaperoned exam.  There is significant fluctuance throughout the right buttock, particularly over the inferior portion with some extension to the superior thigh.  No rectal, groin, perineum, or vaginal involvement.  There is an approximately 1 cm open wound with purulence.  The area is warm and erythematous.  She is exquisite with minimal palpation over the area.  Neurological: She is alert.  Skin: Skin is warm. No rash noted.  Psychiatric: Her behavior is normal.  Nursing note and vitals reviewed.    ED Treatments / Results  Labs (all labs ordered are listed, but only abnormal results are displayed) Labs Reviewed  COMPREHENSIVE METABOLIC PANEL - Abnormal; Notable for the following components:      Result Value   Sodium 130 (*)    Chloride 96 (*)    CO2 20 (*)    Glucose, Bld 351 (*)    Total Protein 8.3 (*)    All other components within normal limits  URINALYSIS, ROUTINE W REFLEX MICROSCOPIC - Abnormal; Notable for the following components:   Color, Urine AMBER (*)    APPearance CLOUDY (*)    Specific Gravity, Urine 1.035 (*)    Glucose, UA >=500 (*)    Ketones, ur 80 (*)    Protein, ur 30 (*)    Leukocytes, UA MODERATE (*)    Bacteria, UA FEW (*)    Squamous Epithelial / LPF 6-30 (*)    All other components within normal limits  CBC WITH DIFFERENTIAL/PLATELET - Abnormal; Notable for the following components:   WBC 22.6 (*)    Platelets 411 (*)    Neutro Abs 19.9 (*)    Monocytes Absolute 1.1 (*)    All other components within normal limits  I-STAT CG4 LACTIC ACID, ED - Abnormal; Notable for the following components:   Lactic Acid, Venous 2.04 (*)    All other components within normal limits  I-STAT BETA HCG BLOOD, ED ([facility], [facility], [facility] ONLY) - Abnormal; Notable for the following components:    I-stat hCG, quantitative 19.5 (*)    All other components within normal limits  I-STAT CG4 LACTIC ACID, ED - Abnormal; Notable for the following components:   Lactic Acid, Venous 2.69 (*)    All other components within normal limits  I-STAT BETA HCG BLOOD, ED ([facility], [facility], [facility] ONLY) - Abnormal; Notable for the following components:   I-stat hCG, quantitative 12.3 (*)    All other components within normal limits  CULTURE, BLOOD (ROUTINE X 2)  CULTURE, BLOOD (ROUTINE X 2)  RAPID URINE DRUG SCREEN, HOSP PERFORMED  LACTIC ACID, PLASMA  PREGNANCY, URINE  HEMOGLOBIN A1C    EKG  EKG Interpretation  Date/Time:  Friday December 26 2017 17:39:03 EST Ventricular Rate:  155 PR Interval:  118 QRS Duration: 72 QT Interval:  238 QTC Calculation: 382 R Axis:  101 Text Interpretation:  Sinus tachycardia Rightward axis Borderline ECG No significant change since last tracing Confirmed by Jerelyn Scott [phone]) on 12/26/2017 6:40:36 PM       Radiology No results found.  Procedures Procedures (including critical care time)  Medications Ordered in ED Medications  vancomycin (VANCOCIN) 2,000 mg in sodium chloride 0.9 % 500 mL IVPB (not administered)  ceFEPIme (MAXIPIME) 2 g in dextrose 5 % 50 mL IVPB (2 g Intravenous New Bag/Given 12/26/17 2026)  vancomycin (VANCOCIN) IVPB 1000 mg/200 mL premix (not administered)  ceFEPIme (MAXIPIME) 1 g in dextrose 5 % 50 mL IVPB (not administered)  sodium chloride 0.9 % bolus 1,000 mL (1,000 mLs Intravenous New Bag/Given 12/26/17 1935)  acetaminophen (TYLENOL) tablet 650 mg (650 mg Oral Given 12/26/17 2027)  morphine 4 MG/ML injection 4 mg (4 mg Intravenous Given 12/26/17 2025)     Initial Impression / Assessment and Plan / ED Course  I have reviewed the triage vital signs and the nursing notes.  Pertinent labs & imaging results that were available during my care of the patient were reviewed by me and considered in my medical decision making (see chart for  details).     32 year old female presenting with fever and rapidly worsening pain and swelling to a wound on the right buttock. She was seen in the ED yesterday for removal of packing from the wound that was I&Ded on 12/31. At that time, he was afebrile without tachycardia.  On arrival today, she is tachycardic in the 140s-150s with a rectal temp of 102.1.  Lactate 2.69.  Leukocytosis of 22.6. Code Sepsis initiated.  Tylenol given for fever.  IV fluid bolus started. Consulted pharmacy for dosing of vancomycin and cefepime since the patient has a childhood PCN allergy with a reaction of hives and swelling.  Morphine given for pain control.  I-STAT beta hCG was minimally elevated; will correlate with urine pregnancy test.  CT pelvis with contrast is ordered and pending.  No history of DM; however upon reviewing her previous labs glucose has been elevated since 2014, including 261 in 11/2013. Will check A1C.  Will plan to admit the patient for IV antibiotics and possibly consult general surgery depending on CT results.  Patient care transferred to PA Upstill at the end of my shift. Patient presentation, ED course, and plan of care discussed with review of all pertinent labs and imaging. Please see his/her note for further details regarding further ED course and disposition.   Final Clinical Impressions(s) / ED Diagnoses   Final diagnoses:  None    ED Discharge Orders    None       Ceylon Arenson A, PA-C 12/26/17 2038    Phillis Haggis, MD 12/26/17 2316

## 2017-12-26 NOTE — ED Notes (Signed)
Code Sepsis @ 1956 (RN aware)

## 2017-12-26 NOTE — ED Notes (Signed)
PA McDonald given a copy of lactic acid 2.69 and I Stat HCG

## 2017-12-26 NOTE — Progress Notes (Signed)
Pharmacy Antibiotic Note  Tracy Weaver is a 32 y.o. female admitted on 12/26/2017 with wound infection.  Pharmacy has been consulted for vancomycin and cefepime dosing. Tmax is elevated at 102.1 and WBC is elevated at 22.6. SCr is WNL. Lactic acid is 2.69.   Plan: Vancomycin 2gm IV x 1 then 1gm IV Q8H Cefepime 2gm IV x 1 then 1gm IV Q8H F/u renal fxn, C&S, clinical status and trough at SS  Height: 5\' 8"  (172.7 cm) Weight: 230 lb (104.3 kg) IBW/kg (Calculated) : 63.9  Temp (24hrs), Avg:100.9 F (38.3 C), Min:99.6 F (37.6 C), Max:102.1 F (38.9 C)  Recent Labs  Lab 12/26/17 1740 12/26/17 1752 12/26/17 1948  WBC 22.6*  --   --   CREATININE 0.76  --   --   LATICACIDVEN  --  2.04* 2.69*    Estimated Creatinine Clearance: 131.2 mL/min (by C-G formula based on SCr of 0.76 mg/dL).    Allergies  Allergen Reactions  . Penicillins Swelling    Has patient had a PCN reaction causing immediate rash, facial/tongue/throat swelling, SOB or lightheadedness with hypotension: yes Has patient had a PCN reaction causing severe rash involving mucus membranes or skin necrosis: no Has patient had a PCN reaction that required hospitalization: no Has patient had a PCN reaction occurring within the last 10 years: no If all of the above answers are "NO", then may proceed with Cephalosporin use.     Antimicrobials this admission: Vanc 1/4>> Cefepime 1/4>>  Dose adjustments this admission: N/A  Microbiology results: Pending  Thank you for allowing pharmacy to be a part of this patient's care.  Tracy Weaver, Tracy Weaver 12/26/2017 8:16 PM

## 2017-12-26 NOTE — ED Notes (Signed)
Nurse will collect lactic acid plasma

## 2017-12-26 NOTE — ED Notes (Signed)
Nurse currently drawing labs 

## 2017-12-26 NOTE — ED Provider Notes (Signed)
Patient placed in Quick Look pathway, seen and evaluated for chief complaint of buttock abscess. Seen yesterday for same.  Compliant with ABX since 12-31. Pertinent H&P findings include pain, subjective fevers, swelling. No redness.  Tachycardia. Based on initial evaluation, labs are indicated and radiology studies not indicated.  Patient counseled on process, plan, and necessity for staying for completing the evaluation.    Audry PiliMohr, Marillyn Goren, PA-C 12/26/17 1729    Margarita Grizzleay, Danielle, MD 12/29/17 0730

## 2017-12-26 NOTE — ED Provider Notes (Signed)
Sepsis Labs pending Recent abscess drainage, packing out yesterday, fever today Compliant with abx CT pending - cefepime and vanc  Plan: admit Review CT - ?surgical consult Manage pain  9:30 - Abscess to right buttock spontaneously ruptured with copious purulent, malodorous, bloody discharge. She feels some relief of the pain after rupture. Patient is going to CT now.  10:30 - Per radiology, CT scan shows extensive cellulitis Upper thigh to buttock without perianal involvement. Evidence gas forming infection. Surgery consulted.  10:50 - Discussed with Dr. Magnus IvanBlackman (surgery) who will consult on the patient. Recommends medical admission. He states he feels the gas forming element of infection is due to recent I&D procedure and spontaneous rupture of abscess tonight. Medicine paged for admission.   11:15 - Dr. Magnus IvanBlackman at bedside for consultation. [facility] Queen. [facility] patient onto [facility] service for treatment of sepsis.    Elpidio AnisUpstill, Jericho Alcorn, PA-C 12/27/17 0004    Phillis HaggisMabe, Martha L, MD 12/27/17 [phone]

## 2017-12-26 NOTE — ED Notes (Signed)
Informed first nurse of lactic acid of 2.04

## 2017-12-26 NOTE — ED Triage Notes (Signed)
Pt had a boil on her buttocks drained on 12/31. She returned to the ED yesterday to have the packing removed but not all packing was removed. Pt states she has been having fevers. Taking bactrim as well.

## 2017-12-26 NOTE — ED Notes (Signed)
Label sent to main lab for A1c blood test.

## 2017-12-27 ENCOUNTER — Inpatient Hospital Stay (HOSPITAL_COMMUNITY): Payer: Self-pay | Admitting: Certified Registered Nurse Anesthetist

## 2017-12-27 ENCOUNTER — Encounter (HOSPITAL_COMMUNITY): Payer: Self-pay | Admitting: Certified Registered Nurse Anesthetist

## 2017-12-27 ENCOUNTER — Other Ambulatory Visit: Payer: Self-pay

## 2017-12-27 ENCOUNTER — Encounter (HOSPITAL_COMMUNITY): Admission: EM | Disposition: A | Discharge status: Home / Self Care | Payer: Self-pay | Source: Home / Self Care

## 2017-12-27 DIAGNOSIS — A419 Sepsis, unspecified organism: Secondary | ICD-10-CM | POA: Diagnosis present

## 2017-12-27 HISTORY — PX: IRRIGATION AND DEBRIDEMENT BUTTOCKS: SHX6601

## 2017-12-27 LAB — CBC
HCT: 34.4 % — ABNORMAL LOW (ref 36.0–46.0)
HEMOGLOBIN: 11.1 g/dL — AB (ref 12.0–15.0)
MCH: 27.6 pg (ref 26.0–34.0)
MCHC: 32.3 g/dL (ref 30.0–36.0)
MCV: 85.6 fL (ref 78.0–100.0)
Platelets: 347 10*3/uL (ref 150–400)
RBC: 4.02 MIL/uL (ref 3.87–5.11)
RDW: 12.6 % (ref 11.5–15.5)
WBC: 20.1 10*3/uL — AB (ref 4.0–10.5)

## 2017-12-27 LAB — BASIC METABOLIC PANEL
ANION GAP: 9 (ref 5–15)
BUN: 6 mg/dL (ref 6–20)
CHLORIDE: 103 mmol/L (ref 101–111)
CO2: 22 mmol/L (ref 22–32)
Calcium: 8.6 mg/dL — ABNORMAL LOW (ref 8.9–10.3)
Creatinine, Ser: 0.51 mg/dL (ref 0.44–1.00)
GFR calc Af Amer: >60 mL/min (ref >60)
GLUCOSE: 270 mg/dL — AB (ref 65–99)
POTASSIUM: 4 mmol/L (ref 3.5–5.1)
SODIUM: 134 mmol/L — AB (ref 135–145)

## 2017-12-27 LAB — GLUCOSE, CAPILLARY
Glucose-Capillary: 247 mg/dL — ABNORMAL HIGH (ref 65–99)
Glucose-Capillary: 247 mg/dL — ABNORMAL HIGH (ref 65–99)
Glucose-Capillary: 242 mg/dL — ABNORMAL HIGH (ref 65–99)
Glucose-Capillary: 359 mg/dL — ABNORMAL HIGH (ref 65–99)
Glucose-Capillary: 326 mg/dL — ABNORMAL HIGH (ref 65–99)

## 2017-12-27 LAB — PROTIME-INR
INR: 1.22
PROTHROMBIN TIME: 15.3 s — AB (ref 11.4–15.2)

## 2017-12-27 LAB — HIV ANTIBODY (ROUTINE TESTING W REFLEX): HIV Screen 4th Generation wRfx: NONREACTIVE

## 2017-12-27 LAB — HCG, SERUM, QUALITATIVE: Preg, Serum: NEGATIVE

## 2017-12-27 LAB — HCG, QUANTITATIVE, PREGNANCY: HCG, BETA CHAIN, QUANT, S: 3 m[IU]/mL (ref <5)

## 2017-12-27 LAB — APTT: APTT: 31 s (ref 24–36)

## 2017-12-27 LAB — SURGICAL PCR SCREEN
MRSA, PCR: NEGATIVE
STAPHYLOCOCCUS AUREUS: NEGATIVE

## 2017-12-27 SURGERY — IRRIGATION AND DEBRIDEMENT BUTTOCKS
Anesthesia: Choice | Site: Buttocks | Laterality: Right

## 2017-12-27 MED ORDER — HYDROMORPHONE HCL 1 MG/ML IJ SOLN
1.0000 mg | INTRAMUSCULAR | Status: DC | PRN
  Administered 2017-12-29: 1 mg via INTRAVENOUS
  Filled 2017-12-27: qty 1

## 2017-12-27 MED ORDER — BUPIVACAINE HCL (PF) 0.25 % IJ SOLN
INTRAMUSCULAR | Status: AC
  Filled 2017-12-27: qty 30

## 2017-12-27 MED ORDER — ONDANSETRON HCL 4 MG/2ML IJ SOLN
INTRAMUSCULAR | Status: DC | PRN
  Administered 2017-12-27: 4 mg via INTRAVENOUS

## 2017-12-27 MED ORDER — ONDANSETRON HCL 4 MG/2ML IJ SOLN: 4.0000 mg | Freq: Four times a day (QID) | INTRAMUSCULAR | Status: DC | PRN

## 2017-12-27 MED ORDER — INSULIN ASPART 100 UNIT/ML ~~LOC~~ SOLN
0.0000 [IU] | SUBCUTANEOUS | Status: DC
  Administered 2017-12-27: 9 [IU] via SUBCUTANEOUS
  Administered 2017-12-27 (×2): 3 [IU] via SUBCUTANEOUS
  Administered 2017-12-27: 7 [IU] via SUBCUTANEOUS
  Administered 2017-12-28: 2 [IU] via SUBCUTANEOUS
  Administered 2017-12-28 (×3): 3 [IU] via SUBCUTANEOUS
  Administered 2017-12-28: 5 [IU] via SUBCUTANEOUS
  Administered 2017-12-28: 3 [IU] via SUBCUTANEOUS
  Administered 2017-12-29 (×2): 2 [IU] via SUBCUTANEOUS
  Administered 2017-12-29: 1 [IU] via SUBCUTANEOUS

## 2017-12-27 MED ORDER — DIPHENHYDRAMINE HCL 25 MG PO CAPS: 25.0000 mg | ORAL_CAPSULE | Freq: Four times a day (QID) | ORAL | Status: DC | PRN

## 2017-12-27 MED ORDER — ENOXAPARIN SODIUM 40 MG/0.4ML ~~LOC~~ SOLN
40.0000 mg | Freq: Every day | SUBCUTANEOUS | Status: DC
  Administered 2017-12-27 – 2017-12-28 (×2): 40 mg via SUBCUTANEOUS
  Filled 2017-12-27 (×3): qty 0.4

## 2017-12-27 MED ORDER — ACETAMINOPHEN 325 MG PO TABS: 650.0000 mg | ORAL_TABLET | Freq: Four times a day (QID) | ORAL | Status: DC | PRN

## 2017-12-27 MED ORDER — MIDAZOLAM HCL 2 MG/2ML IJ SOLN
INTRAMUSCULAR | Status: AC
  Filled 2017-12-27: qty 2

## 2017-12-27 MED ORDER — ONDANSETRON HCL 4 MG PO TABS: 4.0000 mg | ORAL_TABLET | Freq: Four times a day (QID) | ORAL | Status: DC | PRN

## 2017-12-27 MED ORDER — MIDAZOLAM HCL 5 MG/5ML IJ SOLN
INTRAMUSCULAR | Status: DC | PRN
  Administered 2017-12-27: 2 mg via INTRAVENOUS

## 2017-12-27 MED ORDER — ACETAMINOPHEN 650 MG RE SUPP: 650.0000 mg | Freq: Four times a day (QID) | RECTAL | Status: DC | PRN

## 2017-12-27 MED ORDER — SUCCINYLCHOLINE CHLORIDE 20 MG/ML IJ SOLN
INTRAMUSCULAR | Status: DC | PRN
  Administered 2017-12-27: 100 mg via INTRAVENOUS

## 2017-12-27 MED ORDER — DEXAMETHASONE SODIUM PHOSPHATE 10 MG/ML IJ SOLN
INTRAMUSCULAR | Status: DC | PRN
  Administered 2017-12-27: 4 mg via INTRAVENOUS

## 2017-12-27 MED ORDER — PROPOFOL 10 MG/ML IV BOLUS
INTRAVENOUS | Status: AC
  Filled 2017-12-27: qty 20

## 2017-12-27 MED ORDER — IPRATROPIUM-ALBUTEROL 0.5-2.5 (3) MG/3ML IN SOLN: 3.0000 mL | RESPIRATORY_TRACT | Status: DC | PRN

## 2017-12-27 MED ORDER — HYDROCODONE-ACETAMINOPHEN 5-325 MG PO TABS
1.0000 | ORAL_TABLET | Freq: Four times a day (QID) | ORAL | Status: DC | PRN
Start: 2017-12-27 — End: 2017-12-29
  Administered 2017-12-28 (×3): 2 via ORAL
  Filled 2017-12-27 (×3): qty 2

## 2017-12-27 MED ORDER — 0.9 % SODIUM CHLORIDE (POUR BTL) OPTIME
TOPICAL | Status: DC | PRN
  Administered 2017-12-27: 1000 mL

## 2017-12-27 MED ORDER — PHENYLEPHRINE 40 MCG/ML (10ML) SYRINGE FOR IV PUSH (FOR BLOOD PRESSURE SUPPORT)
PREFILLED_SYRINGE | INTRAVENOUS | Status: AC
  Filled 2017-12-27: qty 10

## 2017-12-27 MED ORDER — MORPHINE SULFATE (PF) 4 MG/ML IV SOLN
2.0000 mg | INTRAVENOUS | Status: DC | PRN
  Administered 2017-12-27 – 2017-12-28 (×3): 4 mg via INTRAVENOUS
  Filled 2017-12-27 (×3): qty 1

## 2017-12-27 MED ORDER — KCL IN DEXTROSE-NACL 10-5-0.45 MEQ/L-%-% IV SOLN
INTRAVENOUS | Status: DC
  Filled 2017-12-27: qty 1000

## 2017-12-27 MED ORDER — SODIUM CHLORIDE 0.9 % IV SOLN
INTRAVENOUS | Status: DC
  Administered 2017-12-27: 02:00:00 via INTRAVENOUS

## 2017-12-27 MED ORDER — FENTANYL CITRATE (PF) 100 MCG/2ML IJ SOLN
INTRAMUSCULAR | Status: DC | PRN
  Administered 2017-12-27 (×2): 50 ug via INTRAVENOUS
  Administered 2017-12-27: 100 ug via INTRAVENOUS
  Administered 2017-12-27: 50 ug via INTRAVENOUS

## 2017-12-27 MED ORDER — BUPIVACAINE HCL (PF) 0.25 % IJ SOLN
INTRAMUSCULAR | Status: DC | PRN
  Administered 2017-12-27: 22 mL

## 2017-12-27 MED ORDER — DIPHENHYDRAMINE HCL 50 MG/ML IJ SOLN
25.0000 mg | Freq: Four times a day (QID) | INTRAMUSCULAR | Status: DC | PRN
  Administered 2017-12-29: 25 mg via INTRAVENOUS
  Filled 2017-12-27: qty 1

## 2017-12-27 MED ORDER — PROPOFOL 10 MG/ML IV BOLUS
INTRAVENOUS | Status: DC | PRN
  Administered 2017-12-27: 200 mg via INTRAVENOUS

## 2017-12-27 MED ORDER — FENTANYL CITRATE (PF) 250 MCG/5ML IJ SOLN
INTRAMUSCULAR | Status: AC
  Filled 2017-12-27: qty 5

## 2017-12-27 MED ORDER — LACTATED RINGERS IV SOLN
INTRAVENOUS | Status: DC | PRN
  Administered 2017-12-27: 08:00:00 via INTRAVENOUS

## 2017-12-27 MED ORDER — LACTATED RINGERS IV SOLN: INTRAVENOUS | Status: DC

## 2017-12-27 MED ORDER — LIDOCAINE HCL (CARDIAC) 20 MG/ML IV SOLN
INTRAVENOUS | Status: DC | PRN
  Administered 2017-12-27: 40 mg via INTRAVENOUS

## 2017-12-27 MED ORDER — ONDANSETRON HCL 4 MG/2ML IJ SOLN
INTRAMUSCULAR | Status: AC
  Filled 2017-12-27: qty 2

## 2017-12-27 SURGICAL SUPPLY — 31 items
CANISTER SUCT 3000ML PPV (MISCELLANEOUS) ×3 IMPLANT
CLEANER TIP ELECTROSURG 2X2 (MISCELLANEOUS) IMPLANT
COVER SURGICAL LIGHT HANDLE (MISCELLANEOUS) ×3 IMPLANT
DRAPE UTILITY XL STRL (DRAPES) ×4 IMPLANT
DRSG PAD ABDOMINAL 8X10 ST (GAUZE/BANDAGES/DRESSINGS) ×1 IMPLANT
ELECT REM PT RETURN 9FT ADLT (ELECTROSURGICAL) ×3
ELECTRODE REM PT RTRN 9FT ADLT (ELECTROSURGICAL) IMPLANT
GAUZE PACKING IODOFORM 1 (PACKING) IMPLANT
GAUZE SPONGE 4X4 12PLY STRL (GAUZE/BANDAGES/DRESSINGS) ×1 IMPLANT
GAUZE SPONGE 4X4 16PLY XRAY LF (GAUZE/BANDAGES/DRESSINGS) ×1 IMPLANT
GLOVE BIO SURGEON STRL SZ8 (GLOVE) ×3 IMPLANT
GLOVE BIOGEL PI IND STRL 8 (GLOVE) ×1 IMPLANT
GLOVE BIOGEL PI INDICATOR 8 (GLOVE) ×2
GOWN STRL REUS W/ TWL LRG LVL3 (GOWN DISPOSABLE) ×2 IMPLANT
GOWN STRL REUS W/ TWL XL LVL3 (GOWN DISPOSABLE) ×1 IMPLANT
GOWN STRL REUS W/TWL LRG LVL3 (GOWN DISPOSABLE) ×3
GOWN STRL REUS W/TWL XL LVL3 (GOWN DISPOSABLE) ×3
KIT BASIN OR (CUSTOM PROCEDURE TRAY) ×3 IMPLANT
KIT ROOM TURNOVER OR (KITS) ×3 IMPLANT
NS IRRIG 1000ML POUR BTL (IV SOLUTION) ×3 IMPLANT
PACK LITHOTOMY IV (CUSTOM PROCEDURE TRAY) ×3 IMPLANT
PAD ARMBOARD 7.5X6 YLW CONV (MISCELLANEOUS) ×6 IMPLANT
PENCIL BUTTON HOLSTER BLD 10FT (ELECTRODE) ×2 IMPLANT
SWAB COLLECTION DEVICE MRSA (MISCELLANEOUS) ×3 IMPLANT
SWAB CULTURE ESWAB REG 1ML (MISCELLANEOUS) ×3 IMPLANT
TOWEL OR 17X24 6PK STRL BLUE (TOWEL DISPOSABLE) ×3 IMPLANT
TOWEL OR 17X26 10 PK STRL BLUE (TOWEL DISPOSABLE) ×3 IMPLANT
TUBE CONNECTING 12'X1/4 (SUCTIONS) ×1
TUBE CONNECTING 12X1/4 (SUCTIONS) ×2 IMPLANT
UNDERPAD 30X30 (UNDERPADS AND DIAPERS) ×3 IMPLANT
YANKAUER SUCT BULB TIP NO VENT (SUCTIONS) ×3 IMPLANT

## 2017-12-27 NOTE — Anesthesia Procedure Notes (Signed)
Procedure Name: Intubation Date/Time: 12/27/2017 8:45 AM Performed by: Inda Coke, CRNA Pre-anesthesia Checklist: Patient identified, Emergency Drugs available, Suction available and Patient being monitored Patient Re-evaluated:Patient Re-evaluated prior to induction Oxygen Delivery Method: Circle System Utilized Preoxygenation: Pre-oxygenation with 100% oxygen Induction Type: IV induction Ventilation: Mask ventilation without difficulty Laryngoscope Size: Mac and 3 Grade View: Grade I Tube type: Oral Tube size: 7.5 mm Number of attempts: 1 Airway Equipment and Method: Stylet and Oral airway Placement Confirmation: ETT inserted through vocal cords under direct vision,  positive ETCO2 and breath sounds checked- equal and bilateral Secured at: 21 cm Tube secured with: Tape Dental Injury: Teeth and Oropharynx as per pre-operative assessment

## 2017-12-27 NOTE — Interval H&P Note (Signed)
History and Physical Interval Note:  12/27/2017 7:39 AM  Tracy Weaver  has presented today for surgery, with the diagnosis of abscess  The various methods of treatment have been discussed with the patient and family. After consideration of risks, benefits and other options for treatment, the patient has consented to  Procedure(s): IRRIGATION AND DEBRIDEMENT RIGHT BUTTOCKS (Right) as a surgical intervention .  The patient's history has been reviewed, patient examined, no change in status, stable for surgery.  I have reviewed the patient's chart and labs.  Questions were answered to the patient's satisfaction.     [name] A Cozy Veale

## 2017-12-27 NOTE — Anesthesia Postprocedure Evaluation (Signed)
Anesthesia Post Note  Patient: Jeri ModenaSharea M Grabe  Procedure(s) Performed: IRRIGATION AND DEBRIDEMENT RIGHT BUTTOCKS (Right Buttocks)     Patient location during evaluation: PACU Anesthesia Type: General Level of consciousness: awake and alert, patient cooperative and oriented Pain management: pain level controlled Vital Signs Assessment: post-procedure vital signs reviewed and stable Respiratory status: spontaneous breathing, nonlabored ventilation and respiratory function stable Cardiovascular status: blood pressure returned to baseline and stable Postop Assessment: no apparent nausea or vomiting Anesthetic complications: no    Last Vitals:  Vitals:   12/27/17 1015 12/27/17 1033  BP: 112/86 113/78  Pulse: (!) 110 (!) 107  Resp: 20 12  Temp: (!) 36.4 C 37 C  SpO2: 99% 100%    Last Pain:  Vitals:   12/27/17 1033  TempSrc: Oral  PainSc:                  Ayvin Lipinski,E. Kalup Jaquith

## 2017-12-27 NOTE — Progress Notes (Signed)
Pt admitted from ER per stretcher accompanied by her grandma, on arrival to the floor pt fully alert and oriented, vital signs stable except heart rate being high, has abscess at rt buttock, low fall risk has been encourage to call for help when needed, call light and phone within reach pt able to demonstrate how to use them, necessary pre-op preparation done will continue to monitor pt

## 2017-12-27 NOTE — Transfer of Care (Signed)
Immediate Anesthesia Transfer of Care Note  Patient: Tracy ModenaSharea M Weaver  Procedure(s) Performed: IRRIGATION AND DEBRIDEMENT RIGHT BUTTOCKS (Right Buttocks)  Patient Location: PACU  Anesthesia Type:General  Level of Consciousness: awake and alert   Airway & Oxygen Therapy: Patient Spontanous Breathing and Patient connected to nasal cannula oxygen  Post-op Assessment: Report given to RN, Post -op Vital signs reviewed and stable and Patient moving all extremities X 4  Post vital signs: Reviewed and stable  Last Vitals:  Vitals:   12/27/17 0400 12/27/17 0930  BP:  (P) 113/72  Pulse:  (!) (P) 110  Resp:  (!) (P) 21  Temp: 36.9 C (!) (P) 36.3 C  SpO2:  (P) 95%    Last Pain:  Vitals:   12/27/17 0628  TempSrc:   PainSc: 10-Worst pain ever      Patients Stated Pain Goal: 3 (12/27/17 16100628)  Complications: No apparent anesthesia complications

## 2017-12-27 NOTE — Plan of Care (Signed)
Pain has been well controlled has  not been calling for pain med

## 2017-12-27 NOTE — Anesthesia Preprocedure Evaluation (Addendum)
Anesthesia Evaluation  Patient identified by MRN, date of birth, ID band Patient awake    Reviewed: Allergy & Precautions, NPO status , Patient's Chart, lab work & pertinent test results  History of Anesthesia Complications Negative for: history of anesthetic complications  Airway Mallampati: II  TM Distance: >3 FB Neck ROM: Full    Dental  (+) Dental Advisory Given   Pulmonary neg pulmonary ROS, former smoker,    breath sounds clear to auscultation       Cardiovascular negative cardio ROS   Rhythm:Regular Rate:Normal     Neuro/Psych negative neurological ROS     GI/Hepatic negative GI ROS, Neg liver ROS,   Endo/Other  Morbid obesity  Renal/GU negative Renal ROS     Musculoskeletal   Abdominal (+) + obese,   Peds  Hematology negative hematology ROS (+)   Anesthesia Other Findings   Reproductive/Obstetrics LMP 10/23/17                            Anesthesia Physical Anesthesia Plan  ASA: II  Anesthesia Plan: General   Post-op Pain Management:    Induction: Intravenous  PONV Risk Score and Plan: 3 and Ondansetron and Dexamethasone  Airway Management Planned: Oral ETT  Additional Equipment:   Intra-op Plan:   Post-operative Plan: Extubation in OR  Informed Consent: I have reviewed the patients History and Physical, chart, labs and discussed the procedure including the risks, benefits and alternatives for the proposed anesthesia with the patient or authorized representative who has indicated his/her understanding and acceptance.   Dental advisory given  Plan Discussed with: CRNA and Surgeon  Anesthesia Plan Comments: (Plan routine monitors, GETA)        Anesthesia Quick Evaluation

## 2017-12-27 NOTE — Op Note (Signed)
Preoperative diagnosis: Complex right buttock abscess  Postoperative diagnosis: Same  Procedure: Drainage of complex right buttock abscess  Surgeon: Erroll Luna MD  Anesthesia: General with local  EBL: 30 cc  Specimens: Cultures to microbiology  Drains: None  IV fluids: Per anesthesia record  Indications for procedure: The patient is a 32 year old female with a one-week history of a right buttock abscess.  This was drained last week in the emergency room.  Unfortunately, she developed more pain discomfort and heavier drainage despite incision and drainage.  She was seen last night in the emergency room and found to have a partially drained right buttock abscess and was febrile with a leukocytosis.  She was admitted for definitive drainage today of complex right buttock abscess.The procedure has been discussed with the patient.  Alternative therapies have been discussed with the patient.  Operative risks include bleeding,  Infection,  Organ injury,  Nerve injury,  Blood vessel injury,  DVT,  Pulmonary embolism,  Death,  And possible reoperation.  Medical management risks include worsening of present situation.  The success of the procedure is 50 -90 % at treating patients symptoms.  The patient understands and agrees to proceed.   Description of procedure: The patient was met in the holding area.  Questions were answered.  The site was marked.  She was taken back to the operating room and placed supine on the OR table.  After induction of general anesthesia she was placed in lithotomy and stirrup position and was padded appropriately.  The right buttock abscess was in the medial right buttock and there is a small opening draining pus.  This extended on examination up toward the right groin region.  After sterile prep and drape timeout was done.  She was already on antibiotics.  Wound was cultured.  I placed a Kelly clamp in this tract from the medial right buttock up toward the right groin for  about 5 cm.  There is also some tracking towards the posterior buttock of about 2 cm.  Of note there is no tracking toward the anal canal.  Digital examination was done I could not feel any evidence of a perirectal abscess or other abnormality in the anal canal.  This did not track toward her introitus.  I opened up the wound widely for about 8 cm.  I then opened the skin about 2 more centimeters to make a nice open wound.  The tissues were healthy and there is no signs of any necrotizing infection that I could see.  There is no gas or crepitance in the subcutaneous tissues.  This was irrigated and hemostasis achieved.  This was packed with a Kerlix.  Dry dressings applied.  All final counts found to be correct.  Patient was taken in a lithotomy extubated taken to recovery in satisfactory condition.

## 2017-12-28 ENCOUNTER — Encounter (HOSPITAL_COMMUNITY): Payer: Self-pay | Admitting: Surgery

## 2017-12-28 LAB — GLUCOSE, CAPILLARY
Glucose-Capillary: 161 mg/dL — ABNORMAL HIGH (ref 65–99)
Glucose-Capillary: 191 mg/dL — ABNORMAL HIGH (ref 65–99)
Glucose-Capillary: 212 mg/dL — ABNORMAL HIGH (ref 65–99)
Glucose-Capillary: 212 mg/dL — ABNORMAL HIGH (ref 65–99)
Glucose-Capillary: 221 mg/dL — ABNORMAL HIGH (ref 65–99)
Glucose-Capillary: 244 mg/dL — ABNORMAL HIGH (ref 65–99)
Glucose-Capillary: 278 mg/dL — ABNORMAL HIGH (ref 65–99)

## 2017-12-28 LAB — VANCOMYCIN, TROUGH: VANCOMYCIN TR: 11 ug/mL — AB (ref 15–20)

## 2017-12-28 MED ORDER — SODIUM CHLORIDE 0.9 % IV SOLN
1250.0000 mg | Freq: Three times a day (TID) | INTRAVENOUS | Status: DC
  Administered 2017-12-28 – 2017-12-29 (×2): 1250 mg via INTRAVENOUS
  Filled 2017-12-28 (×3): qty 1250

## 2017-12-28 NOTE — Progress Notes (Signed)
Pharmacy Antibiotic Note Tracy Weaver is a 32 y.o. female admitted on 12/26/2017 with R buttocks abscess s/p I&D in OR on 1/5. Currently on day 2 of cefepime and vancomycin for treatment.   Vancomycin trough this evening is slightly therapeutic at 11 based on indication.   Plan: 1. Increase vancomycin to 1250 mg every 8 hours 2. Cefepime 1 gram IV every 8 hours  Height: 5\' 8"  (172.7 cm) Weight: 230 lb (104.3 kg) IBW/kg (Calculated) : 63.9  Temp (24hrs), Avg:98.5 F (36.9 C), Min:98 F (36.7 C), Max:99.1 F (37.3 C)  Recent Labs  Lab 12/26/17 1740 12/26/17 1752 12/26/17 1948 12/26/17 2011 12/27/17 0615 12/28/17 1335  WBC 22.6*  --   --   --  20.1*  --   CREATININE 0.76  --   --   --  0.51  --   LATICACIDVEN  --  2.04* 2.69* 2.2*  --   --   VANCOTROUGH  --   --   --   --   --  11*     Allergies  Allergen Reactions  . Penicillins Swelling    Childhood reaction Has patient had a PCN reaction causing immediate rash, facial/tongue/throat swelling, SOB or lightheadedness with hypotension: yes Has patient had a PCN reaction causing severe rash involving mucus membranes or skin necrosis: no Has patient had a PCN reaction that required hospitalization: no Has patient had a PCN reaction occurring within the last 10 years: no If all of the above answers are "NO", then may proceed with Cephalosporin use.     Antimicrobials this admission: 1/4 cefepime >>  1/4 vancomycin >>   Microbiology results: 1/4 BCx: ngtd 1/5 MRSA PCR: pending 1/5: R buttocks abscess: px  Thank you for allowing pharmacy to be a part of this patient's care.  Sheron NightingaleJames A Krupa Stege 12/28/2017 3:20 PM

## 2017-12-28 NOTE — Progress Notes (Signed)
Inpatient Diabetes Program Recommendations  AACE/ADA: New Consensus Statement on Inpatient Glycemic Control (2015)  Target Ranges:  Prepandial:   less than 140 mg/dL      Peak postprandial:   less than 180 mg/dL (1-2 hours)      Critically ill patients:  140 - 180 mg/dL   Lab Results  Component Value Date   GLUCAP 191 (H) 12/28/2017   HGBA1C 10.5 (H) 12/26/2017    Review of Glycemic Control  Results for Tracy Weaver, Tracy Weaver (MRN [id number]) as of 12/28/2017 09:19  Ref. Range 12/27/2017 16:42 12/27/2017 20:10 12/28/2017 00:11 12/28/2017 04:32 12/28/2017 07:52  Glucose-Capillary Latest Ref Range: 65 - 99 mg/dL 409359 (H) 811326 (H) 914244 (H) 212 (H) 191 (H)    Diabetes history: A1C 10.5% Outpatient Diabetes medications: none noted Current orders for Inpatient glycemic control: Novolog 0-9 units q4h   Inpatient Diabetes Program Recommendations: Elevated A1C (6.5 or greater). Per ADA guidelines this meets the criteria of a diagnosis for diabetes. If appropriate, please consider placing this diagnosis in chart and inform patient of diagnosis.   Once this has been discussed with the patient, please order Living well with Diabetes book, order a dietitian consult and have patient watch patient education videos on diabetes: 501-Basic skills for controlling diabetes; 502-Preventing Long Term Complications; 503-Managing Your Diabetes; 504-Introduction to Carb Counting; 505-Diabetes: Foot Care; 506-Diabetes: Insulin Injection; 507-Diabetes & Nutrition: Eating for Health; 508-Diabetes: Reducing Fears About Injections; 509-Diabetes and Heart Disease; 510-Monitoring Blood Glucose.  Please use each patient interaction to provide diabetes education. Please review Living Well with Diabetes booklet with the patient,, and, if appropriate,  instruct on insulin administration. Please allow patient to be actively engaged with diabetes management by allowing patient to check own glucose and self-administer insulin injections.  Diabetes Coordinator will follow up with patient and reinforce diabetes education tomorrow.   Susette RacerJulie Bernestine Holsapple, RN, BA, MHA, CDE Diabetes Coordinator Inpatient Diabetes Program  [phone] (Team Pager)  12/28/2017 9:27 AM

## 2017-12-28 NOTE — Progress Notes (Signed)
1 Day Post-Op   Subjective/Chief Complaint: Pain in right buttock not up yet eating   Objective: Vital signs in last 24 hours: Temp:  [98 F (36.7 C)-99.1 F (37.3 C)] 99.1 F (37.3 C) (01/05 2124) Pulse Rate:  [97-103] 103 (01/06 0952) Resp:  [16-17] 17 (01/05 2124) BP: (117-129)/(74-96) 127/96 (01/06 0952) SpO2:  [99 %-100 %] 100 % (01/06 0952) Last BM Date: 12/26/17  Intake/Output from previous day: 01/05 0701 - 01/06 0700 In: 3276.7 [I.V.:2526.7; IV Piggyback:750] Out: 825 [Urine:800; Blood:25] Intake/Output this shift: No intake/output data recorded.  Right buttock abscess wound clean with some drainage  Lab Results:  Recent Labs    12/26/17 1740 12/27/17 0615  WBC 22.6* 20.1*  HGB 13.5 11.1*  HCT 40.2 34.4*  PLT 411* 347   BMET Recent Labs    12/26/17 1740 12/27/17 0615  NA 130* 134*  K 3.8 4.0  CL 96* 103  CO2 20* 22  GLUCOSE 351* 270*  BUN 12 6  CREATININE 0.76 0.51  CALCIUM 9.8 8.6*   PT/INR Recent Labs    12/27/17 0615  LABPROT 15.3*  INR 1.22   ABG No results for input(s): PHART, HCO3 in the last 72 hours.  Invalid input(s): PCO2, PO2  Studies/Results: Ct Pelvis W Contrast  Result Date: 12/26/2017 CLINICAL DATA:  Abscess in the buttocks. Severe pain and swelling. Bubble well on buttocks was drained on 12/22/2017. Packing was removed in ED yesterday. Some packing remains. EXAM: CT PELVIS WITH CONTRAST TECHNIQUE: Multidetector CT imaging of the pelvis was performed using the standard protocol following the bolus administration of intravenous contrast. CONTRAST:  100mL ISOVUE-300 IOPAMIDOL (ISOVUE-300) INJECTION 61% COMPARISON:  None. FINDINGS: Urinary Tract: Bladder wall is not thickened. No bladder filling defects. Distal ureters are decompressed. Bowel: Scattered gas and stool in the visualized colon. No dilatation of visualized small or large bowel. Appendix is normal. Vascular/Lymphatic: Prominent enhancing lymph nodes in the right groin  are likely reactive. No pathologically enlarged lymph nodes. No significant vascular abnormality seen. Reproductive: Exophytic Iso enhancing lesion arising from the posterior uterine fundus likely representing an exophytic fibroid. No abnormal adnexal masses. Other:  No free air or free fluid demonstrated in the pelvis. There is extensive soft tissue infiltration and skin thickening along the medial aspect of the right upper thigh, extending into the perineal region. Multiple small gas collections are demonstrated within the indurated fat. Changes are consistent with cellulitis due gas-forming organism. No discrete loculated fluid collection is identified. Inflammatory changes extend just to the inferior right anal margins. Musculoskeletal: No suspicious bone lesions identified. IMPRESSION: 1. Extensive soft tissue infiltration and skin thickening along the medial aspect of the right upper thigh and extending to the perineal region to the base of the right anal rim. Soft tissue gas collections are present consistent with cellulitis due to gas-forming organism. No discrete loculated fluid collection. 2. Small fibroid crash that small exophytic fibroid on the uterus. These results were called by telephone at the time of interpretation on 12/26/2017 at 10:23 pm to PA. MIA MCDONALD , who verbally acknowledged these results. Electronically Signed   By: Burman NievesWilliam  Stevens M.D.   On: 12/26/2017 22:26    Anti-infectives: Anti-infectives (From admission, onward)   Start     Dose/Rate Route Frequency Ordered Stop   12/27/17 0600  vancomycin (VANCOCIN) IVPB 1000 mg/200 mL premix     1,000 mg 200 mL/hr over 60 Minutes Intravenous Every 8 hours 12/26/17 2016     12/27/17 0600  ceFEPIme (  MAXIPIME) 1 g in dextrose 5 % 50 mL IVPB     1 g 100 mL/hr over 30 Minutes Intravenous Every 8 hours 12/26/17 2016     12/26/17 2015  vancomycin (VANCOCIN) 2,000 mg in sodium chloride 0.9 % 500 mL IVPB     2,000 mg 250 mL/hr over 120  Minutes Intravenous  Once 12/26/17 2005 12/27/17 0102   12/26/17 2015  ceFEPIme (MAXIPIME) 2 g in dextrose 5 % 50 mL IVPB     2 g 100 mL/hr over 30 Minutes Intravenous  Once 12/26/17 2005 12/26/17 2056      Assessment/Plan: POD 1 right buttock abscess I and D  -continue abx, recheck wbc in am -oob today -follow glucose -lovenox scds -dressing changes  Tracy Weaver 12/28/2017

## 2017-12-29 LAB — CBC
HCT: 32.5 % — ABNORMAL LOW (ref 36.0–46.0)
Hemoglobin: 10.4 g/dL — ABNORMAL LOW (ref 12.0–15.0)
MCH: 27.7 pg (ref 26.0–34.0)
MCHC: 32 g/dL (ref 30.0–36.0)
MCV: 86.4 fL (ref 78.0–100.0)
PLATELETS: 386 10*3/uL (ref 150–400)
RBC: 3.76 MIL/uL — ABNORMAL LOW (ref 3.87–5.11)
RDW: 12.9 % (ref 11.5–15.5)
WBC: 11.6 10*3/uL — ABNORMAL HIGH (ref 4.0–10.5)

## 2017-12-29 LAB — GLUCOSE, CAPILLARY
GLUCOSE-CAPILLARY: 157 mg/dL — AB (ref 65–99)
Glucose-Capillary: 127 mg/dL — ABNORMAL HIGH (ref 65–99)

## 2017-12-29 LAB — BASIC METABOLIC PANEL
Anion gap: 9 (ref 5–15)
BUN: 10 mg/dL (ref 6–20)
CALCIUM: 8.7 mg/dL — AB (ref 8.9–10.3)
CO2: 25 mmol/L (ref 22–32)
Chloride: 100 mmol/L — ABNORMAL LOW (ref 101–111)
Creatinine, Ser: 0.5 mg/dL (ref 0.44–1.00)
GFR calc Af Amer: >60 mL/min (ref >60)
GFR calc non Af Amer: >60 mL/min (ref >60)
GLUCOSE: 203 mg/dL — AB (ref 65–99)
Potassium: 3.6 mmol/L (ref 3.5–5.1)
Sodium: 134 mmol/L — ABNORMAL LOW (ref 135–145)

## 2017-12-29 MED ORDER — HYDROCODONE-ACETAMINOPHEN 5-325 MG PO TABS: 1.0000 | ORAL_TABLET | ORAL | 0 refills | Status: DC | PRN

## 2017-12-29 MED ORDER — METFORMIN HCL 500 MG PO TABS: 500.0000 mg | ORAL_TABLET | Freq: Two times a day (BID) | ORAL | 1 refills | Status: DC

## 2017-12-29 MED ORDER — SULFAMETHOXAZOLE-TRIMETHOPRIM 800-160 MG PO TABS: 1.0000 | ORAL_TABLET | Freq: Two times a day (BID) | ORAL | 0 refills | Status: AC

## 2017-12-29 NOTE — Consult Note (Addendum)
WOC Nurse wound consult note Reason for Consult: Surgical team following for assessment and plan of care to buttock wound after I&D was performed earlier this week.  PA at the bedside to assess wound appearance.   Wound type: Full thickness post-op wound to inner buttock; 5X4X3cm, wound tunnels upwards at 12:00 o'clock to 4 cm Wound bed: beefy red and moist Drainage (amount, consistency, odor) mod amt tan drainage, no odor Periwound: intact skin surrounding Dressing procedure/placement/frequency: Pt was medicated prior to the procedure and tolerated with mod amt discomfort. Applied moist gauze packing, using swab to fill. Applied ABD pad and mesh underwear to hold in place. Demonstrated technique to patient and family member at the bedside for when she is discharged.  They asked appropriate questions and said they will be able to perform the process.   Please re-consult if further assistance is needed.  Thank-you,  Cammie Mcgeeawn Earleen Aoun MSN, RN, CWOCN, Windsor[place]CN-AP, CNS [phone]

## 2017-12-29 NOTE — Progress Notes (Signed)
Inpatient Diabetes Program Recommendations  AACE/ADA: New Consensus Statement on Inpatient Glycemic Control (2015)  Target Ranges:  Prepandial:   less than 140 mg/dL      Peak postprandial:   less than 180 mg/dL (1-2 hours)      Critically ill patients:  140 - 180 mg/dL   Lab Results  Component Value Date   GLUCAP 127 (H) 12/29/2017   HGBA1C 10.5 (H) 12/26/2017   Review of Glycemic Control  Diabetes history: New Diagnosis  Discharge DM medications: Metformin 500 mg BID  Spoke with patient about new diabetes diagnosis.  Discussed A1C results (10.5%) and explained what an A1C is. Discussed basic pathophysiology of DM Type 2, basic home care, importance of checking CBGs and maintaining good CBG control to prevent long-term and short-term complications. Reviewed glucose and A1C goals.  Reviewed signs and symptoms of hyperglycemia and hypoglycemia along with treatment for both. Discussed impact of nutrition, exercise, stress, sickness, and medications on diabetes control. Reviewed Living Well with diabetes booklet and encouraged patient to read through entire book. Informed patient that she will be prescribed Metformin BID at home. Discussed side effects of Metformin and to take with food. Provided patient with handout information on Reli-On products and encouraged patient to go to [facility] to get the Reli-On Prime glucometer for $9 and a box of 50 Reli-On test strips for $9. Asked patient to check her glucose 2 times per day and to keep a log book of glucose readings and insulin taken. Patient verbalized understanding of information discussed and has no further questions at this time related to diabetes.  Thanks, Christena DeemShannon Jeramy Dimmick RN, MSN, [facility] Inpatient Diabetes Coordinator Team Pager [phone] (8a-5p)

## 2017-12-29 NOTE — Discharge Summary (Signed)
[facility] Discharge Summary   Patient ID: Tracy Weaver MRN: [id number] DOB/AGE: January 01, 1986 32 y.o.  Admit date: 12/26/2017 Discharge date: 12/29/2017  Admitting Diagnosis: Right buttock abscess  Discharge Diagnosis Patient Active Problem List   Diagnosis Date Noted  . Sepsis (HCC) 12/27/2017  . Abscess of right buttock 12/26/2017  . Gallstones with obstruction of gallbladder 09/27/2013  . Fatty liver disease, nonalcoholic 09/27/2013  . Obesity 09/27/2013  . Gallstones without obstruction of gallbladder 09/27/2013    Consultants None  Imaging: No results found.  Procedures Dr. Luisa Hart (12/27/16) - Drainage of complex right buttock abscess  Hospital Course:  Tracy Weaver is a 31yo female who [facility] to [facility] 1[facility] with worsening right buttock abscess. She had bedside I&D in the ED 12/31. Since that time she had fever, nausea, vomiting, and increasing discomfort.  Workup showed leukocytosis and lactic acidosis.  Patient was admitted and underwent procedure listed above.  Tolerated procedure well and was transferred to the floor.  She was kept on IV antibiotics during admission. Diabetes coordinator was consulted for newly found diagnosis of diabetes. On POD2 the patient was voiding well, tolerating diet, ambulating well, pain well controlled, vital signs stable, incisions c/d/i, tolerating dressing changes and felt stable for discharge home. She will go home on augmentin and follow up with Dr. Luisa Hart in 7-10 days. She will also go home on metformin and follow up with PCP for management. Patient knows to call with questions or concerns.  I have personally reviewed the patients medication history on the [place] controlled substance database.    Physical Exam: General:  Alert, NAD, pleasant, comfortable Pulm: effort normal Cardio: RRR Abd:  Soft, ND/NT GU: right buttock abscess s/p I&D, wound clean without purulent drainage, tissue is beefy red, trace bloody  drainage, no fluctuance  Allergies as of 12/29/2017      Reactions   Penicillins Swelling   Childhood reaction Has patient had a PCN reaction causing immediate rash, facial/tongue/throat swelling, SOB or lightheadedness with hypotension: yes Has patient had a PCN reaction causing severe rash involving mucus membranes or skin necrosis: no Has patient had a PCN reaction that required hospitalization: no Has patient had a PCN reaction occurring within the last 10 years: no If all of the above answers are "NO", then may proceed with Cephalosporin use.      Medication List    TAKE these medications   HYDROcodone-acetaminophen 5-325 MG tablet Commonly known as:  NORCO/VICODIN Take 1 tablet by mouth every 4 (four) hours as needed for severe pain. What changed:    how much to take  when to take this  reasons to take this   metFORMIN 500 MG tablet Commonly known as:  GLUCOPHAGE Take 1 tablet (500 mg total) by mouth 2 (two) times daily with a meal.   sulfamethoxazole-trimethoprim 800-160 MG tablet Commonly known as:  BACTRIM DS,SEPTRA DS Take 1 tablet by mouth 2 (two) times daily for 7 days. What changed:  additional instructions      Follow-up Information    Cornett, Maisie Fus, MD. Call in 1 week(s).   Specialty:  General Surgery Why:  We are working on your appointment, please call to confirm. Please arrive 30 minutes prior to your appointment to check in and fill out paperwork. Bring insurance information and photo ID. Contact information: [address] [place] [address] [phone]        [facility]. Call.   Why:  You need  to establish a primary care physician. Please call to do so. You will need someone to follow up with regarding your new diagnosis of diabetes. Contact information: [address] [place] [phone]             Signed: Franne FortsBrooke A Pricsilla Lindvall, [facility] 12/29/2017, 9:53 AM Pager: [phone] Consults: [phone] Mon-Fri 7:00 am-4:30 pm Sat-Sun 7:00 am-11:30 am

## 2017-12-29 NOTE — Care Management Note (Signed)
Case Management Note  Patient Details  Name: Tracy Weaver MRN: [id number] Date of Birth: 09/16/1986  Subjective/Objective:     Wound infection               Action/Plan: Discharge Planning: NCM spoke to pt. Prov[facility] with coupon [facility]. [facility]  [facility]. [facility]    Expected Discharge Date:  12/29/17               Expected Discharge Plan:  Home/Self Care  In-House Referral:  NA  Discharge planning Services  CM Consult, Follow-up appt scheduled  Post Acute Care Choice:  NA Choice offered to:  Patient  DME Arranged:  N/A DME Agency:  NA  HH Arranged:  NA HH Agency:  NA  Status of Service:  Completed, signed off  If discussed at Long Length of Stay Meetings, dates discussed:    Additional Comments:  Elliot CousinShavis, Teagan Heidrick Ellen, RN 12/29/2017, 10:21 AM

## 2017-12-29 NOTE — Progress Notes (Signed)
Jeri ModenaSharea M Covin to be D/C'd Home per MD order.  Discussed with the patient and all questions fully answered.  VSS, Skin clean, dry and intact without evidence of skin break down, no evidence of skin tears noted. IV catheter discontinued intact. Site without signs and symptoms of complications. Dressing and pressure applied.  An After Visit Summary was printed and given to the patient. Patient received prescription and work note.   D/c education completed with patient/family including follow up instructions, medication list, d/c activities limitations if indicated, with other d/c instructions as indicated by MD - patient able to verbalize understanding, all questions fully answered.   Patient instructed to return to ED, call 911, or call MD for any changes in condition.   Patient escorted via WC, and D/C home via private auto.  Allergies as of 12/29/2017      Reactions   Penicillins Swelling   Childhood reaction Has patient had a PCN reaction causing immediate rash, facial/tongue/throat swelling, SOB or lightheadedness with hypotension: yes Has patient had a PCN reaction causing severe rash involving mucus membranes or skin necrosis: no Has patient had a PCN reaction that required hospitalization: no Has patient had a PCN reaction occurring within the last 10 years: no If all of the above answers are "NO", then may proceed with Cephalosporin use.      Medication List    TAKE these medications   HYDROcodone-acetaminophen 5-325 MG tablet Commonly known as:  NORCO/VICODIN Take 1 tablet by mouth every 4 (four) hours as needed for severe pain. What changed:    how much to take  when to take this  reasons to take this   metFORMIN 500 MG tablet Commonly known as:  GLUCOPHAGE Take 1 tablet (500 mg total) by mouth 2 (two) times daily with a meal.   sulfamethoxazole-trimethoprim 800-160 MG tablet Commonly known as:  BACTRIM DS,SEPTRA DS Take 1 tablet by mouth 2 (two) times daily for  7 days. What changed:  additional instructions      Casper HarrisonSamantha K Santiago Stenzel 12/29/2017 1:32 PM

## 2017-12-29 NOTE — Plan of Care (Signed)
Met with patient to discuss survival skills  s/s hypoglycemia and treatment s/s hyperglycemia and treatment A1c results, goal is 7% or less (150 avg glucose) New medications they will be on (Metformin) Check glucose and how often (1-2 times for oral meds) Watching carb intake (45-60 grams/meal, 15 g/snack for females), plate method, watching beverage options. Exercising 30 minutes 5 days a week when able after her wound heals.  Patient to follow up at clinic and get supplies at [facility]

## 2017-12-30 LAB — AEROBIC/ANAEROBIC CULTURE (SURGICAL/DEEP WOUND)

## 2017-12-30 LAB — AEROBIC/ANAEROBIC CULTURE W GRAM STAIN (SURGICAL/DEEP WOUND)

## 2017-12-31 LAB — CULTURE, BLOOD (ROUTINE X 2)
Culture: NO GROWTH
Culture: NO GROWTH
SPECIAL REQUESTS: ADEQUATE
Special Requests: ADEQUATE

## 2018-01-01 ENCOUNTER — Telehealth: Payer: Self-pay

## 2018-01-01 NOTE — Telephone Encounter (Addendum)
Call received from Gae GallopAngela Cole, RN CM.  She noted that the patient has an appointment scheduled [facility] on 01/08/18 but the patient  is very anxious about her wound care and would like an appointment earlier than 01/08/18.   An appointment was scheduled for 01/05/18 @ 0945. Marylene Landngela stated that she would call and notify the patient.   [facility] appointment on 01/05/18 be cancelled as the patient told her that she has an appointment with the surgeon on 01/05/18. Marylene Landngela also stated that she instructed the patient to inform the surgeon that she will need wound care supplies.

## 2018-01-05 ENCOUNTER — Inpatient Hospital Stay: Payer: Self-pay | Admitting: Family Medicine

## 2018-01-07 NOTE — Progress Notes (Deleted)
Patient ID: Tracy ModenaSharea M Deam, female   DOB: 05/12/1986, 32 y.o.   MRN: [id number]  After being hospitalized 12/26/2017-12/29/2017 for an abscess of the R buttocks.  She was also diagnosed with diabetes.   Supposed to f/up general surgery  Procedures Dr. Luisa Hartornett (12/27/16) - Drainage of complex right buttock abscess  Hospital Course:  Tracy Weaver is a 31yo female who [facility] with worsening right buttock abscess. She had bedside I&D in the ED 12/31. Since that time she had fever, nausea, vomiting, and increasing discomfort.  Workup showed leukocytosis and lactic acidosis.  Patient was admitted and underwent procedure listed above.  Tolerated procedure well and was transferred to the floor.  She was kept on IV antibiotics during admission. Diabetes coordinator was consulted for newly found diagnosis of diabetes. On POD2 the patient was voiding well, tolerating diet, ambulating well, pain well controlled, vital signs stable, incisions c/d/i, tolerating dressing changes and felt stable for discharge home. She will go home on augmentin and follow up with Dr. Luisa Hartornett in 7-10 days. She will also go home on metformin and follow up with PCP for management. Patient knows to call with questions or concerns.

## 2018-01-08 ENCOUNTER — Inpatient Hospital Stay: Payer: Self-pay

## 2018-01-08 ENCOUNTER — Telehealth

## 2018-01-08 MED ORDER — SULFAMETHOXAZOLE-TRIMETHOPRIM 800-160 MG PO TABS
800-160 MG | ORAL_TABLET | Freq: Two times a day (BID) | ORAL | 0 refills | Status: AC
Start: 2018-01-08 — End: 2018-01-18

## 2018-01-08 MED ORDER — NITROFURANTOIN MONOHYD MACRO 100 MG PO CAPS
100 MG | ORAL_CAPSULE | Freq: Two times a day (BID) | ORAL | 0 refills | Status: DC
Start: 2018-01-08 — End: 2018-09-23

## 2018-01-08 NOTE — Telephone Encounter (Signed)
CANCEL BACTRIM as called pharmacy  AND USE MACROBID 100 MG  BID

## 2018-01-08 NOTE — Telephone Encounter (Signed)
Date of last visit:  11/03/2017  Date of next visit:  05/04/2018    Requested Prescriptions     Signed Prescriptions Disp Refills   . sulfamethoxazole-trimethoprim (BACTRIM DS;SEPTRA DS) 800-160 MG per tablet 20 tablet 0     Sig: Take 1 tablet by mouth 2 times daily for 10 days     Authorizing Provider: Kennis CarinaREMOULIS, EDWARD L     Ordering User: Layson Bertsch, Jory SimsAMANDA L       Rashaun informed by Phone.

## 2018-01-30 NOTE — Telephone Encounter (Signed)
Date of last visit:  11/03/2017  Date of next visit:  05/04/2018    Requested Prescriptions     Pending Prescriptions Disp Refills   . citalopram (CELEXA) 20 MG tablet 180 tablet 2     Sig: Take 2 tablets by mouth daily

## 2018-01-30 NOTE — Telephone Encounter (Signed)
Patient called stating she spoke with Marchelle Folks and she increased the Citalopram to 2  tabs at bedtime and now she needs a new RX to [facility] in [place].

## 2018-01-31 MED ORDER — CITALOPRAM HYDROBROMIDE 20 MG PO TABS
20 | ORAL_TABLET | Freq: Every day | ORAL | 2 refills | Status: DC
Start: 2018-01-31 — End: 2018-10-02

## 2018-04-30 ENCOUNTER — Ambulatory Visit
Admit: 2018-04-30 | Discharge: 2018-04-30 | Payer: PRIVATE HEALTH INSURANCE | Attending: Family Medicine | Primary: Family Medicine

## 2018-04-30 DIAGNOSIS — R5383 Other fatigue: Secondary | ICD-10-CM

## 2018-04-30 LAB — POCT HEMOGLOBIN: Hemoglobin: 16

## 2018-04-30 NOTE — Progress Notes (Signed)
Subjective:      Patient ID: Valerie Todd is a 32 y.o. female.       7  Hours  Of  Sleep  Ok        More  Fatigue      Sleeps  [facility] and gets  Tired    And  mense and  Now    Tired          on  bcp    And  On  3  Month   Cycle  Pill        No  Caffeine and  No  Fruits and    Eating  Better        In  The  Whole  Shift  Tired        6  Pm and   Works  12  To  14   Hours          Fatigue   This is a new problem. The current episode started 1 to 4 weeks ago. The problem occurs daily. Associated symptoms include fatigue. Pertinent negatives include no abdominal pain, arthralgias, chest pain, congestion, coughing, fever, headaches, joint swelling, nausea, neck pain, numbness, rash, sore throat or weakness. Nothing aggravates the symptoms. The treatment provided no relief.     Past Medical History:   Diagnosis Date   ??? Allergic rhinitis      Review of Systems   Constitutional: Positive for fatigue. Negative for appetite change and fever.   HENT: Negative for congestion, ear pain, postnasal drip and sore throat.    Eyes: Negative for pain and visual disturbance.   Respiratory: Negative for cough, chest tightness, shortness of breath and wheezing.    Cardiovascular: Negative for chest pain, palpitations and leg swelling.   Gastrointestinal: Negative for abdominal pain, constipation and nausea.   Genitourinary: Negative for dysuria and frequency.   Musculoskeletal: Negative for arthralgias, joint swelling, neck pain and neck stiffness.   Skin: Negative for rash.   Neurological: Negative for dizziness, weakness, numbness and headaches.   Hematological: Negative for adenopathy. Does not bruise/bleed easily.   Psychiatric/Behavioral: Negative for behavioral problems and sleep disturbance. The patient is not nervous/anxious.      BP 122/74 (Site: Right Upper Arm, Position: Sitting, Cuff Size: Medium Adult)    Pulse 76    Resp 14    Ht  (1.702 m)    Wt 239 lb (108.4 kg)    BMI 37.43 kg/m??   Objective:    Physical Exam   Constitutional: She is oriented to person, place, and time. She appears well-developed and well-nourished.   HENT:   Head: Normocephalic and atraumatic.   Right Ear: External ear normal.   Left Ear: External ear normal.   Nose: Nose normal.   Mouth/Throat: Oropharynx is clear and moist.   Eyes: Pupils are equal, round, and reactive to light. Conjunctivae and EOM are normal. No scleral icterus.   Neck: Normal range of motion. Neck supple. No JVD present. No thyromegaly present.   Cardiovascular: Normal rate, regular rhythm, normal heart sounds and intact distal pulses.   Pulmonary/Chest: Effort normal and breath sounds normal. She has no wheezes. She has no rales.   Abdominal: Soft. Bowel sounds are normal. She exhibits no distension and no mass. There is no tenderness.   Musculoskeletal: Normal range of motion. She exhibits no tenderness.   Lymphadenopathy:     She has  no cervical adenopathy.   Neurological: She is alert and oriented to person, place, and time. She has normal reflexes. No cranial nerve deficit.   Skin: Skin is warm and dry. No rash noted.   Psychiatric: She has a normal mood and affect.   Nursing note and vitals reviewed.      Assessment:       Diagnosis Orders   1. Fatigue, unspecified type  POCT hemoglobin   2. Major depressive disorder with single episode, in full remission (HCC)     3. Chronic seasonal allergic rhinitis due to pollen           Plan:      Current Outpatient Medications   Medication Sig Dispense Refill   ??? citalopram (CELEXA) 20 MG tablet Take 2 tablets by mouth daily 180 tablet 2   ??? JOLESSA 0.15-0.03 MG per tablet Take 1 tablet by mouth daily 1 packet 11     No current facility-administered medications for this visit.            Off work  5-9  To 05-03-18  return  05-04-18  Lab   From  Before  Stable   and  No issues  But  On cycle  And    Fatigue   Kennis Carina, MD

## 2018-05-04 ENCOUNTER — Encounter: Attending: Family Medicine | Primary: Family Medicine

## 2018-08-04 MED ORDER — JOLESSA 0.15-0.03 MG PO TABS
PACK | Freq: Every day | ORAL | 3 refills | Status: DC
Start: 2018-08-04 — End: 2019-03-15

## 2018-08-04 NOTE — Telephone Encounter (Signed)
Date of last visit:  04/30/2018  Date of next visit:  Visit date not found    Requested Prescriptions     Signed Prescriptions Disp Refills   ??? JOLESSA 0.15-0.03 MG per tablet 3 packet 3     Sig: Take 1 tablet by mouth daily     Authorizing Provider: Kennis CarinaREMOULIS, EDWARD L     Ordering User: Laythan Hayter L

## 2018-08-10 ENCOUNTER — Encounter: Attending: Family Medicine | Primary: Family Medicine

## 2018-08-17 ENCOUNTER — Encounter: Attending: Family Medicine | Primary: Family Medicine

## 2018-09-23 ENCOUNTER — Ambulatory Visit
Admit: 2018-09-23 | Discharge: 2018-09-23 | Payer: BLUE CROSS/BLUE SHIELD | Attending: Family Medicine | Primary: Family Medicine

## 2018-09-23 DIAGNOSIS — J301 Allergic rhinitis due to pollen: Secondary | ICD-10-CM

## 2018-09-23 MED ORDER — NITROFURANTOIN MONOHYD MACRO 100 MG PO CAPS
100 MG | ORAL_CAPSULE | Freq: Two times a day (BID) | ORAL | 0 refills | Status: AC
Start: 2018-09-23 — End: 2018-10-03

## 2018-09-23 MED ORDER — CETIRIZINE HCL 10 MG PO TABS
10 MG | ORAL_TABLET | Freq: Every day | ORAL | 2 refills | Status: DC
Start: 2018-09-23 — End: 2020-08-30

## 2018-09-23 NOTE — Progress Notes (Signed)
Subjective:      Patient ID: Valerie Todd is a 32 y.o. female.      Depression  Stable     Stable   Stable  And    Good  With  celexa    Stable  40 mg        Allergies   Stable   On  Zyrtec     Past Medical History:   Diagnosis Date   ??? Allergic rhinitis      Review of Systems   Constitutional: Negative for appetite change, fatigue and fever.   HENT: Negative for congestion, ear pain, postnasal drip and sore throat.    Eyes: Negative for pain and visual disturbance.   Respiratory: Negative for cough, chest tightness, shortness of breath and wheezing.    Cardiovascular: Negative for chest pain, palpitations and leg swelling.   Gastrointestinal: Negative for abdominal pain, constipation and nausea.   Genitourinary: Negative for dysuria and frequency.   Musculoskeletal: Negative for arthralgias, joint swelling, neck pain and neck stiffness.   Skin: Negative for rash.   Neurological: Negative for dizziness, weakness, numbness and headaches.   Hematological: Negative for adenopathy. Does not bruise/bleed easily.   Psychiatric/Behavioral: Negative for behavioral problems and sleep disturbance. The patient is not nervous/anxious.      BP 126/76 (Site: Right Upper Arm, Position: Sitting, Cuff Size: Medium Adult)    Pulse 72    Resp 14    Ht 5\' 7"  (1.702 m)    Wt 253 lb (114.8 kg)    BMI 39.63 kg/m??   Objective:   Physical Exam   Constitutional: She is oriented to person, place, and time. She appears well-developed and well-nourished.   HENT:   Head: Normocephalic and atraumatic.   Right Ear: External ear normal.   Left Ear: External ear normal.   Nose: Nose normal.   Mouth/Throat: Oropharynx is clear and moist.   Eyes: Pupils are equal, round, and reactive to light. Conjunctivae and EOM are normal. No scleral icterus.   Neck: Normal range of motion. Neck supple. No JVD present. No thyromegaly present.   Cardiovascular: Normal rate, regular rhythm, normal heart sounds and intact distal pulses.   Pulmonary/Chest: Effort  normal and breath sounds normal. She has no wheezes. She has no rales.   Abdominal: Soft. Bowel sounds are normal. She exhibits no distension and no mass. There is no tenderness.   Musculoskeletal: Normal range of motion. She exhibits no tenderness.   Lymphadenopathy:     She has no cervical adenopathy.   Neurological: She is alert and oriented to person, place, and time. She has normal reflexes. No cranial nerve deficit.   Skin: Skin is warm and dry. No rash noted.   Psychiatric: She has a normal mood and affect.   Nursing note and vitals reviewed.      Assessment:       Diagnosis Orders   1. Chronic seasonal allergic rhinitis due to pollen  cetirizine (ZYRTEC) 10 MG tablet   2. Acute cystitis without hematuria  nitrofurantoin, macrocrystal-monohydrate, (MACROBID) 100 MG capsule   3. Major depressive disorder with single episode, in full remission [facility])           Plan:        Current Outpatient Medications   Medication Sig Dispense Refill   ??? nitrofurantoin, macrocrystal-monohydrate, (MACROBID) 100 MG capsule Take 1 capsule by mouth 2 times daily for 10 days 20 capsule 0   ??? cetirizine (ZYRTEC) 10 MG tablet Take 1  tablet by mouth daily 30 tablet 2   ??? JOLESSA 0.15-0.03 MG per tablet Take 1 tablet by mouth daily 3 packet 3   ??? citalopram (CELEXA) 20 MG tablet Take 2 tablets by mouth daily 180 tablet 2     No current facility-administered medications for this visit.        No orders of the defined types were placed in this encounter.   see in  4 mths      Kennis Carina, MD

## 2018-09-27 IMAGING — CT CT PELVIS W/ CM
2 of 3 series · 16 of 46 positions shown, 18 images · IV contrast (iopamidol)
Comparison: None.

CLINICAL DATA: Abscess in the buttocks. Severe pain and swelling.
Bubble well on buttocks was drained on 12/22/2017. Packing was
removed in ED yesterday. Some packing remains.

EXAM:
CT PELVIS WITH CONTRAST
TECHNIQUE: Multidetector CT imaging of the pelvis was performed using the
standard protocol following the bolus administration of intravenous
contrast.
CONTRAST:  100mL FS25S7-SVV IOPAMIDOL (FS25S7-SVV) INJECTION 61%

[Series 3: pelvis with 5.0 · axial · 0.98mm/px · z∈[-526,-260]mm · 13 of 61 slices shown, 15 images]
[im 4/61  soft-tissue]
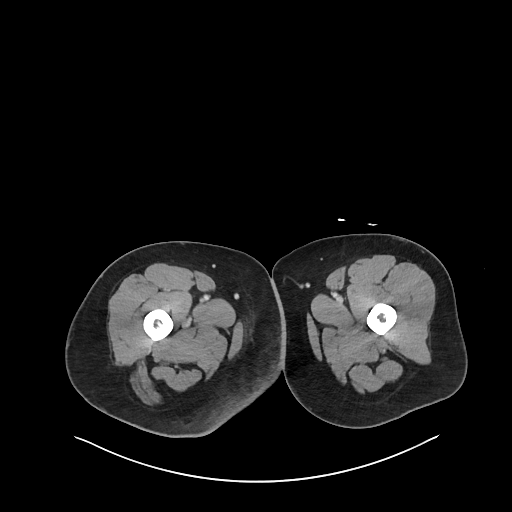
[im 4/61  bone]
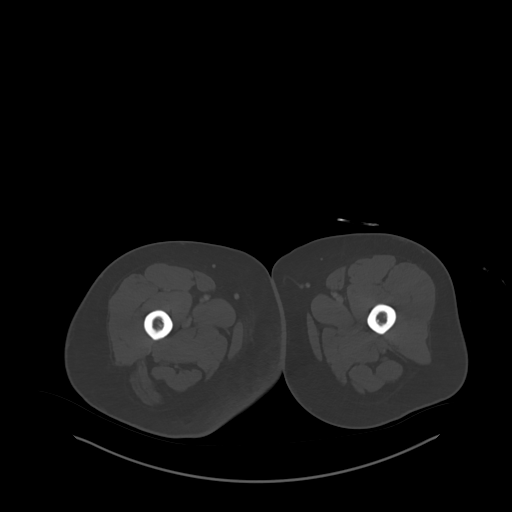
[im 8/61  soft-tissue]
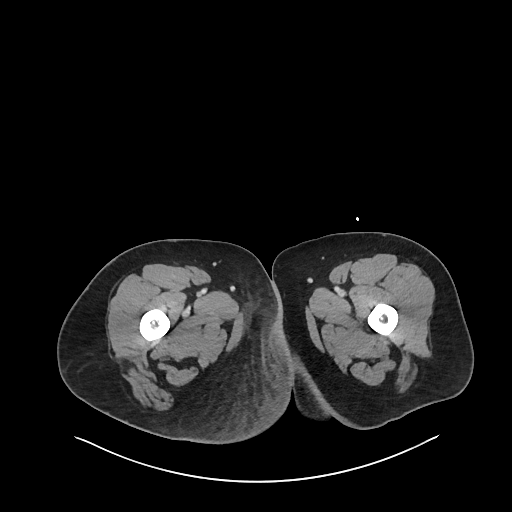
[im 12/61  soft-tissue]
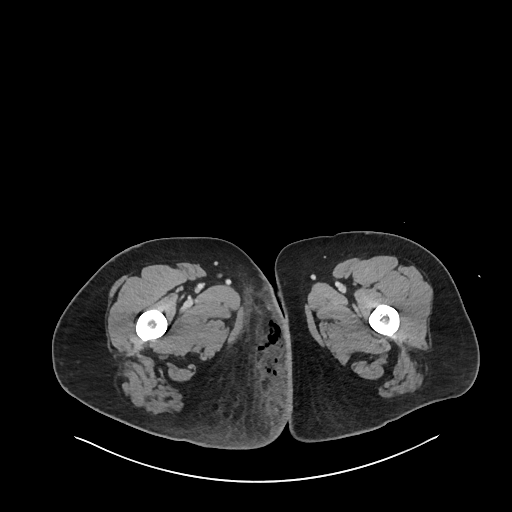
[im 18/61  soft-tissue]
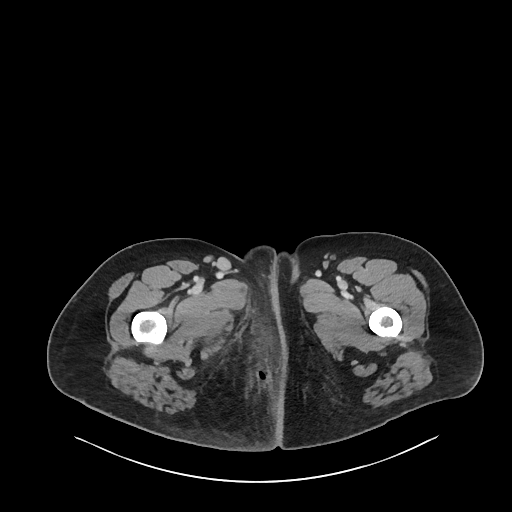
[im 22/61  soft-tissue]
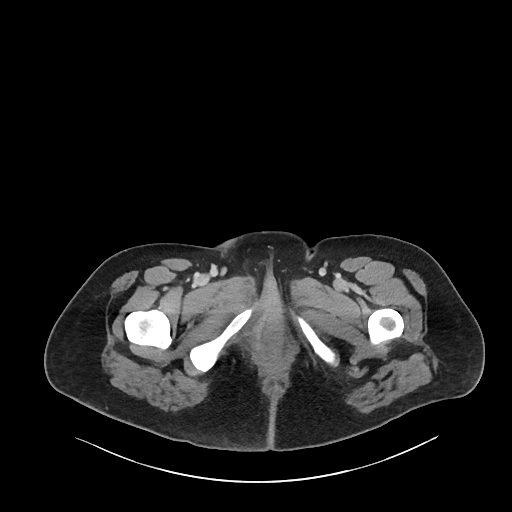
[im 26/61  soft-tissue]
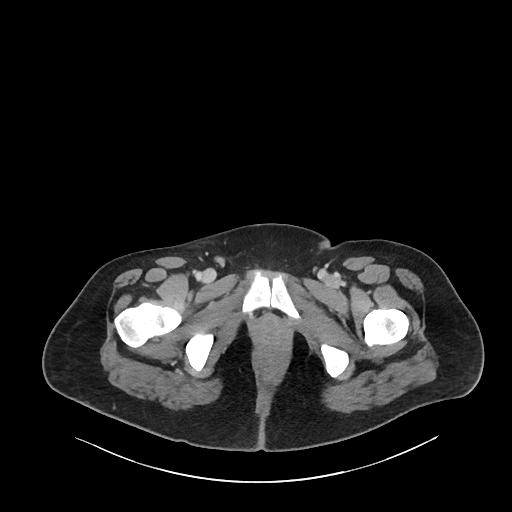
[im 31/61  soft-tissue]
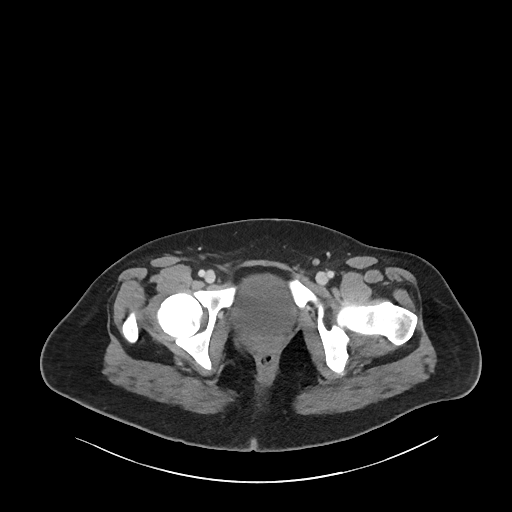
[im 35/61  soft-tissue]
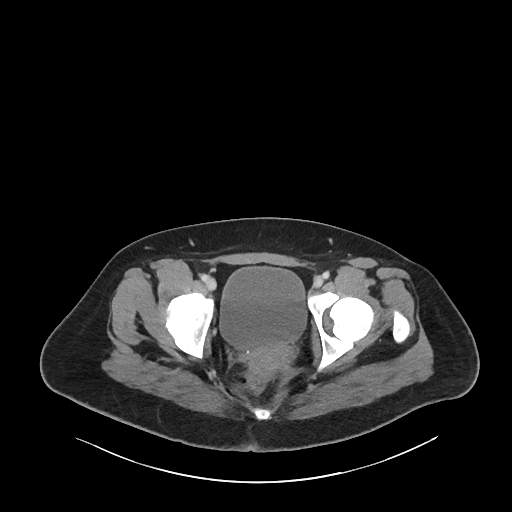
[im 39/61  soft-tissue]
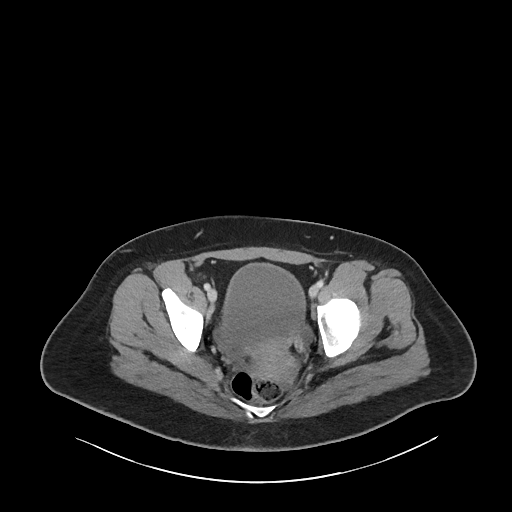
[im 39/61  bone]
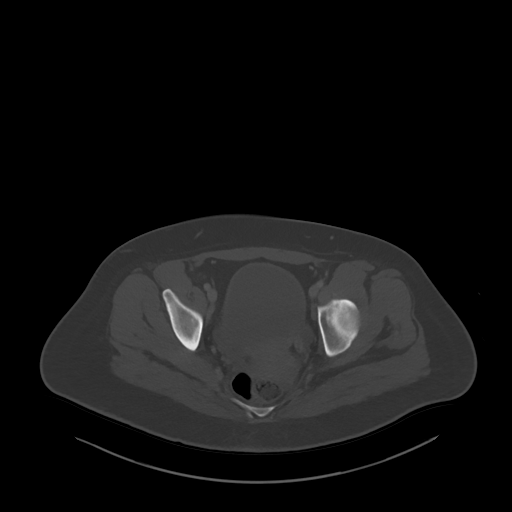
[im 43/61  soft-tissue]
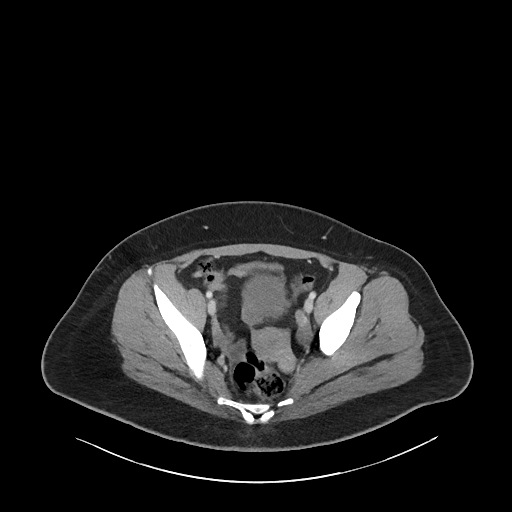
[im 49/61  soft-tissue]
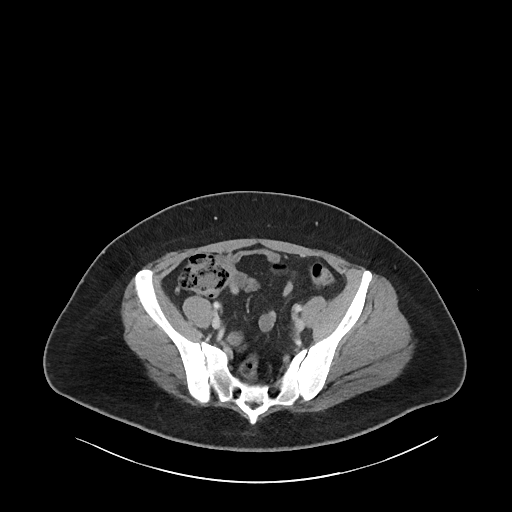
[im 53/61  soft-tissue]
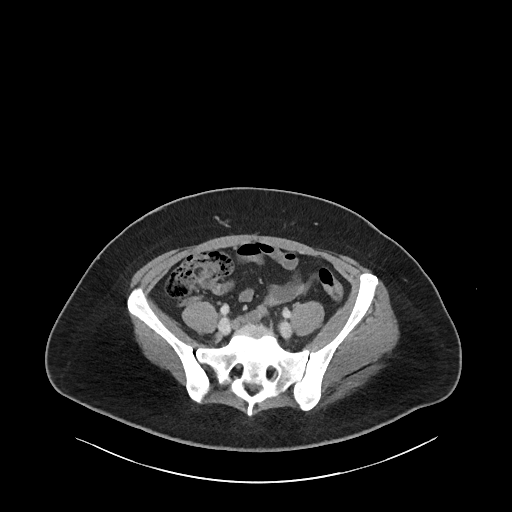
[im 57/61  soft-tissue]
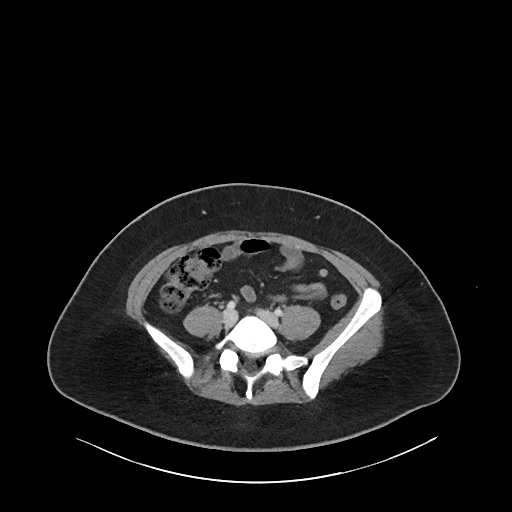

[Series 5: pelvis with 2.0 cor · coronal · 0.63mm/px · 3 of 135 slices shown]
[im 45/135  soft-tissue]
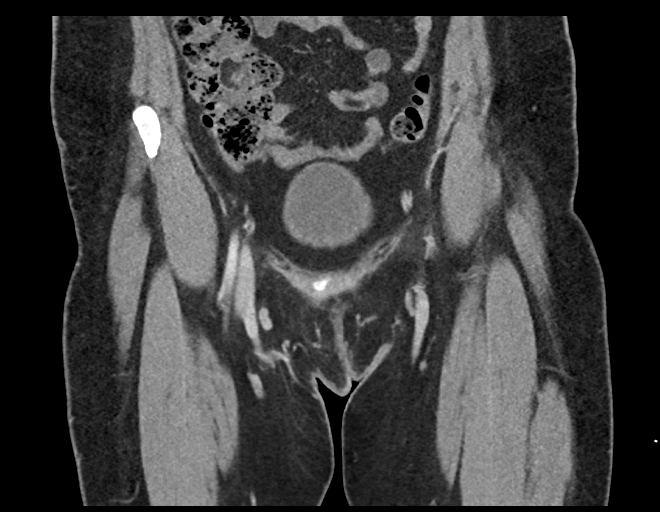
[im 60/135  soft-tissue]
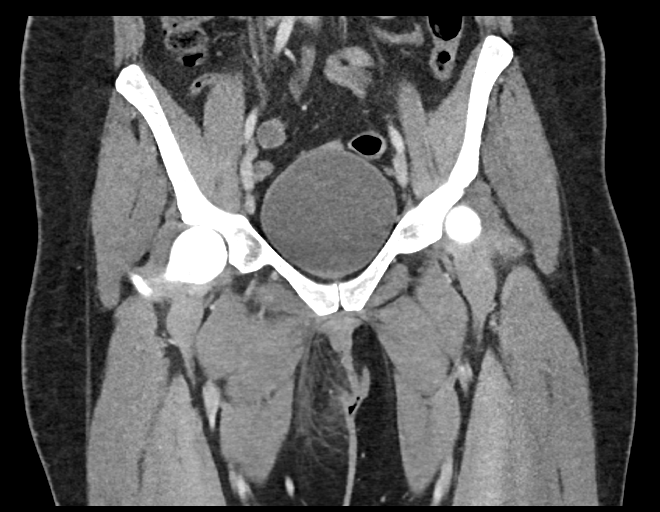
[im 75/135  soft-tissue]
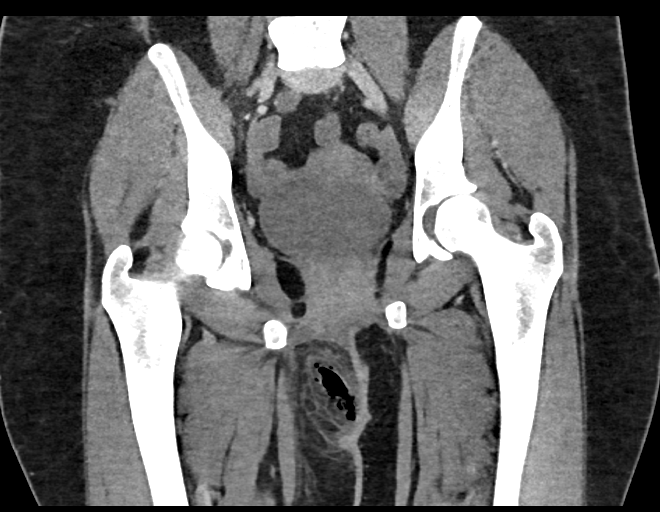

[16 of 46 positions shown; findings below may reference images not displayed]

FINDINGS: Urinary Tract: Bladder wall is not thickened. No bladder filling
defects. Distal ureters are decompressed.

Bowel: Scattered gas and stool in the visualized colon. No
dilatation of visualized small or large bowel. Appendix is normal.

Vascular/Lymphatic: Prominent enhancing lymph nodes in the right
groin are likely reactive. No pathologically enlarged lymph nodes.
No significant vascular abnormality seen.

Reproductive: Exophytic Iso enhancing lesion arising from the
posterior uterine fundus likely representing an exophytic fibroid.
No abnormal adnexal masses.

Other:  No free air or free fluid demonstrated in the pelvis.

There is extensive soft tissue infiltration and skin thickening
along the medial aspect of the right upper thigh, extending into the
perineal region. Multiple small gas collections are demonstrated
within the indurated fat. Changes are consistent with cellulitis due
gas-forming organism. No discrete loculated fluid collection is
identified. Inflammatory changes extend just to the inferior right
anal margins.

Musculoskeletal: No suspicious bone lesions identified.
IMPRESSION: 1. Extensive soft tissue infiltration and skin thickening along the
medial aspect of the right upper thigh and extending to the perineal
region to the base of the right anal rim. Soft tissue gas
collections are present consistent with cellulitis due to
gas-forming organism. No discrete loculated fluid collection.
2. Small fibroid crash that small exophytic fibroid on the uterus.
These results were called by telephone at the time of interpretation
on 12/26/2017 at [DATE] to PA. LORENZ JUMPER , who verbally
acknowledged these results.

## 2018-10-02 MED ORDER — CITALOPRAM HYDROBROMIDE 20 MG PO TABS
20 MG | ORAL_TABLET | Freq: Every day | ORAL | 2 refills | Status: DC
Start: 2018-10-02 — End: 2019-07-19

## 2018-10-02 NOTE — Telephone Encounter (Signed)
Date of last visit:  09/23/2018  Date of next visit:  01/25/2019    Requested Prescriptions     Signed Prescriptions Disp Refills   ??? citalopram (CELEXA) 20 MG tablet 180 tablet 2     Sig: Take 2 tablets by mouth daily     Authorizing Provider: Kennis Carina     Ordering User: Enyah Moman L

## 2019-01-25 ENCOUNTER — Encounter: Attending: Family Medicine | Primary: Family Medicine

## 2019-03-15 ENCOUNTER — Ambulatory Visit
Admit: 2019-03-15 | Discharge: 2019-03-15 | Payer: PRIVATE HEALTH INSURANCE | Attending: Family Medicine | Primary: Family Medicine

## 2019-03-15 DIAGNOSIS — J301 Allergic rhinitis due to pollen: Secondary | ICD-10-CM

## 2019-03-15 NOTE — Progress Notes (Signed)
Subjective:      Patient ID: Valerie Todd is a 33 y.o. female.        Allergies  Stable  And  No  Uri   and  with allegies        With depression noted and   With off  bcp .  Saw  Psych  Arts development officer and  Better         Off  bcp and  menstrual    Cycle   Of   And  Feel better      more  Emotional  And  Not  out  Of  Control      Past Medical History:   Diagnosis Date   ??? Allergic rhinitis      Review of Systems   Constitutional: Negative for appetite change, fatigue and fever.   HENT: Positive for congestion and postnasal drip. Negative for ear pain and sore throat.    Eyes: Negative for pain and visual disturbance.   Respiratory: Negative for cough, chest tightness, shortness of breath and wheezing.    Cardiovascular: Negative for chest pain, palpitations and leg swelling.   Gastrointestinal: Negative for abdominal pain, constipation and nausea.   Genitourinary: Negative for dysuria and frequency.   Musculoskeletal: Negative for arthralgias, joint swelling, neck pain and neck stiffness.   Skin: Negative for rash.   Neurological: Negative for dizziness, weakness, numbness and headaches.          anxiety  Noted    Hematological: Negative for adenopathy. Does not bruise/bleed easily.   Psychiatric/Behavioral: Negative for behavioral problems and sleep disturbance. The patient is not nervous/anxious.      BP 115/70 (Site: Right Upper Arm, Position: Sitting, Cuff Size: Medium Adult)    Pulse 86    Resp 12    Ht 5\' 7"  (1.702 m)    Wt 268 lb 4 oz (121.7 kg)    BMI 42.01 kg/m??   Objective:   Physical Exam  Vitals signs and nursing note reviewed.   Constitutional:       Appearance: She is well-developed.   HENT:      Head: Normocephalic and atraumatic.      Right Ear: External ear normal.      Left Ear: External ear normal.      Nose: Nose normal.   Eyes:      General: No scleral icterus.     Conjunctiva/sclera: Conjunctivae normal.      Pupils: Pupils are equal, round, and reactive to light.   Neck:       Musculoskeletal: Normal range of motion and neck supple.      Thyroid: No thyromegaly.      Vascular: No JVD.   Cardiovascular:      Rate and Rhythm: Normal rate and regular rhythm.      Heart sounds: Normal heart sounds.   Pulmonary:      Effort: Pulmonary effort is normal.      Breath sounds: Normal breath sounds. No wheezing or rales.   Abdominal:      General: Bowel sounds are normal. There is no distension.      Palpations: Abdomen is soft. There is no mass.      Tenderness: There is no abdominal tenderness.   Musculoskeletal: Normal range of motion.         General: No tenderness.   Lymphadenopathy:      Cervical: No cervical adenopathy.   Skin:     General: Skin is  warm and dry.      Findings: No rash.   Neurological:      Mental Status: She is alert and oriented to person, place, and time.      Cranial Nerves: No cranial nerve deficit.      Deep Tendon Reflexes: Reflexes are normal and symmetric.         Assessment:       Diagnosis Orders   1. Chronic seasonal allergic rhinitis due to pollen     2. Major depressive disorder with single episode, in full remission [facility])           Plan:      Current Outpatient Medications   Medication Sig Dispense Refill   ??? citalopram (CELEXA) 20 MG tablet Take 2 tablets by mouth daily 180 tablet 2   ??? cetirizine (ZYRTEC) 10 MG tablet Take 1 tablet by mouth daily 30 tablet 2     No current facility-administered medications for this visit.          On  celexa  At  20  Mgt  Only and off bcp and  Go to  40  Mg  celexa  if  Needed     See in  4 mths  And  If  Good  Then  To  6  mths  Kennis Carina, MD

## 2019-07-19 ENCOUNTER — Telehealth
Admit: 2019-07-19 | Discharge: 2019-07-19 | Payer: PRIVATE HEALTH INSURANCE | Attending: Family Medicine | Primary: Family Medicine

## 2019-07-19 ENCOUNTER — Encounter: Payer: PRIVATE HEALTH INSURANCE | Attending: Family Medicine | Primary: Family Medicine

## 2019-07-19 DIAGNOSIS — G8929 Other chronic pain: Secondary | ICD-10-CM

## 2019-07-19 MED ORDER — CITALOPRAM HYDROBROMIDE 20 MG PO TABS
20 MG | ORAL_TABLET | Freq: Every day | ORAL | 1 refills | Status: DC
Start: 2019-07-19 — End: 2019-11-22

## 2019-07-19 NOTE — Progress Notes (Addendum)
Subjective:      Patient ID: Valerie Todd is a 33 y.o. female.    Patient verified Video Chat was not encrypted, and agrees to the Video Exam in presence of nurse.     HPI     Patient verified Video Chat was not encrypted, and agrees to the Video Exam in presence of nurse.        depression and  Anxiety    And  Stable   Counselor   celexa       Going  Through  Divorce          back pain    Review of Systems   Constitutional: Negative for appetite change, fatigue and fever.   HENT: Negative for congestion, ear pain, postnasal drip and sore throat.    Eyes: Negative for pain and visual disturbance.   Respiratory: Negative for cough, chest tightness, shortness of breath and wheezing.    Cardiovascular: Negative for chest pain, palpitations and leg swelling.   Gastrointestinal: Negative for abdominal pain, constipation and nausea.   Genitourinary: Negative for dysuria and frequency.   Musculoskeletal: Positive for back pain. Negative for arthralgias, joint swelling, neck pain and neck stiffness.   Skin: Negative for rash.   Neurological: Negative for dizziness, weakness, numbness and headaches.   Hematological: Negative for adenopathy. Does not bruise/bleed easily.   Psychiatric/Behavioral: Negative for behavioral problems and sleep disturbance. The patient is not nervous/anxious.        Objective:   Physical Exam  Constitutional:       General: She is not in acute distress.     Appearance: Normal appearance. She is not ill-appearing or toxic-appearing.   Musculoskeletal: Normal range of motion.   Neurological:      General: No focal deficit present.      Mental Status: She is oriented to person, place, and time.      Cranial Nerves: No cranial nerve deficit.      Sensory: No sensory deficit.      Motor: No weakness.      Gait: Gait normal.         Assessment:      .   Diagnosis Orders   1. Chronic midline low back pain without sciatica     2. Major depressive disorder with single episode, in full remission ([place])      3. Chronic seasonal allergic rhinitis due to pollen           Plan:       Current Outpatient Medications   Medication Sig Dispense Refill   ??? levonorgestrel-ethinyl estradiol (JOLESSA) 0.15-0.03 MG per tablet Take 1 tablet by mouth daily     ??? citalopram (CELEXA) 20 MG tablet Take 1 tablet by mouth daily 90 tablet 1   ??? cetirizine (ZYRTEC) 10 MG tablet Take 1 tablet by mouth daily 30 tablet 2     No current facility-administered medications for this visit.    No orders of the defined types were placed in this encounter.    covid  Precautions    Video  15  mins       See in  4 mths     Kerrin Champagne, MD

## 2019-09-20 NOTE — Telephone Encounter (Addendum)
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 It also needs to state that she has been compliant with treatment and keeping her appointment.    She would like the letter faxed to [employer] at [phone]. On cover page it needs to have pt's full name and appointment at [facility].    Thresa may be reached at [phone]

## 2019-09-20 NOTE — Telephone Encounter (Signed)
Date of last visit:  07/19/2019  Date of next visit:  09/20/2019    Requested Prescriptions     Pending Prescriptions Disp Refills   ??? levonorgestrel-ethinyl estradiol (JOLESSA) 0.15-0.03 MG per tablet 1 packet 2     Sig: Take 1 tablet by mouth daily

## 2019-09-20 NOTE — Telephone Encounter (Signed)
Pt called for a refill of:    levonorgestrel-ethinyl estradiol (JOLESSA) 0.15-0.03 MG per tablet QD    Send to [facility] in [place]

## 2019-09-21 MED ORDER — LEVONORGEST-ETH ESTRAD 91-DAY 0.15-0.03 MG PO TABS
PACK | Freq: Every day | ORAL | 2 refills | Status: DC
Start: 2019-09-21 — End: 2019-11-22

## 2019-09-21 NOTE — Telephone Encounter (Signed)
Letter completed and faxed. Huntley Dec informed by Phone.

## 2019-10-25 NOTE — Progress Notes (Signed)
Patient cancelled appt.

## 2019-11-22 ENCOUNTER — Ambulatory Visit
Admit: 2019-11-22 | Discharge: 2019-11-22 | Payer: PRIVATE HEALTH INSURANCE | Attending: Family Medicine | Primary: Family Medicine

## 2019-11-22 DIAGNOSIS — J301 Allergic rhinitis due to pollen: Secondary | ICD-10-CM

## 2019-11-22 MED ORDER — LEVONORGEST-ETH ESTRAD 91-DAY 0.15-0.03 MG PO TABS
PACK | Freq: Every day | ORAL | 2 refills | Status: DC
Start: 2019-11-22 — End: 2020-03-27

## 2019-11-22 MED ORDER — CITALOPRAM HYDROBROMIDE 20 MG PO TABS
20 MG | ORAL_TABLET | Freq: Every day | ORAL | 1 refills | Status: DC
Start: 2019-11-22 — End: 2020-03-27

## 2019-11-22 NOTE — Progress Notes (Signed)
Immunizations Administered     Name Date Dose Route    Influenza, Quadv, IM, (6 mo and older Fluzone, Flulaval, Fluarix and 3 yrs and older Afluria) 11/22/2019 0.5 mL Intramuscular    Site: Deltoid- Left    Lot: [id number]    NDC: 49281-633-78

## 2019-11-22 NOTE — Progress Notes (Signed)
Subjective:      Patient ID: Valerie Todd is a 33 y.o. female.      Gyn   Noted and  Needs   Pap  In  Time      allergies   Stable      depression  Noted and    With  Menses    Poor and   With  celexa  To  40  Mg  Stable  For   5  Days   Before and  After  menses    .  Past Medical History:   Diagnosis Date   ??? Allergic rhinitis      Review of Systems   Constitutional: Negative for appetite change, fatigue and fever.   HENT: Negative for congestion, ear pain, postnasal drip and sore throat.    Eyes: Negative for pain and visual disturbance.   Respiratory: Negative for cough, chest tightness, shortness of breath and wheezing.    Cardiovascular: Negative for chest pain, palpitations and leg swelling.   Gastrointestinal: Negative for abdominal pain, constipation and nausea.   Genitourinary: Negative for dysuria and frequency.   Musculoskeletal: Negative for arthralgias, joint swelling, neck pain and neck stiffness.   Skin: Negative for rash.   Neurological: Negative for dizziness, weakness, numbness and headaches.   Hematological: Negative for adenopathy. Does not bruise/bleed easily.   Psychiatric/Behavioral: Negative for behavioral problems and sleep disturbance. The patient is not nervous/anxious.      BP 130/76 (Site: Right Upper Arm, Position: Sitting, Cuff Size: Medium Adult)    Pulse 76    Temp 96.7 ??F (35.9 ??C) (Oral)    Resp 16    Ht 5\' 7"  (1.702 m)    Wt 281 lb (127.5 kg)    BMI 44.01 kg/m??   Objective:   Physical Exam  Vitals signs and nursing note reviewed.   Constitutional:       Appearance: She is well-developed.   HENT:      Head: Normocephalic and atraumatic.      Right Ear: External ear normal.      Left Ear: External ear normal.      Nose: Nose normal.   Eyes:      General: No scleral icterus.     Conjunctiva/sclera: Conjunctivae normal.      Pupils: Pupils are equal, round, and reactive to light.   Neck:      Musculoskeletal: Normal range of motion and neck supple.      Thyroid: No thyromegaly.       Vascular: No JVD.   Cardiovascular:      Rate and Rhythm: Normal rate and regular rhythm.      Heart sounds: Normal heart sounds.   Pulmonary:      Effort: Pulmonary effort is normal.      Breath sounds: Normal breath sounds. No wheezing or rales.   Abdominal:      General: Bowel sounds are normal. There is no distension.      Palpations: Abdomen is soft. There is no mass.      Tenderness: There is no abdominal tenderness.   Musculoskeletal: Normal range of motion.         General: No tenderness.   Lymphadenopathy:      Cervical: No cervical adenopathy.   Skin:     General: Skin is warm and dry.      Findings: No rash.   Neurological:      Mental Status: She is alert and oriented to person, place,  and time.      Cranial Nerves: No cranial nerve deficit.      Deep Tendon Reflexes: Reflexes are normal and symmetric.         Assessment:       Diagnosis Orders   1. Chronic seasonal allergic rhinitis due to pollen     2. Need for influenza vaccination  INFLUENZA, QUADV, 0.5ML, 6 MO AND OLDER, IM, MDV, (FLUZONE QUADV)   3. Major depressive disorder with single episode, in full remission [facility])           Plan:      Current Outpatient Medications   Medication Sig Dispense Refill   ??? levonorgestrel-ethinyl estradiol (JOLESSA) 0.15-0.03 MG per tablet Take 1 tablet by mouth daily 1 packet 2   ??? citalopram (CELEXA) 20 MG tablet Take 1 tablet by mouth daily 90 tablet 1   ??? cetirizine (ZYRTEC) 10 MG tablet Take 1 tablet by mouth daily 30 tablet 2     No current facility-administered medications for this visit.          Orders Placed This Encounter   Procedures   ??? INFLUENZA, QUADV, 0.5ML, 6 MO AND OLDER, IM, MDV, (FLUZONE QUADV)       See in  4  mtsh      Kennis Carina, MD

## 2020-03-27 ENCOUNTER — Ambulatory Visit
Admit: 2020-03-27 | Discharge: 2020-03-27 | Payer: PRIVATE HEALTH INSURANCE | Attending: Family Medicine | Primary: Family Medicine

## 2020-03-27 DIAGNOSIS — Z01419 Encounter for gynecological examination (general) (routine) without abnormal findings: Secondary | ICD-10-CM

## 2020-03-27 MED ORDER — LEVONORGEST-ETH ESTRAD 91-DAY 0.15-0.03 MG PO TABS
PACK | Freq: Every day | ORAL | 5 refills | Status: AC
Start: 2020-03-27 — End: ?

## 2020-03-27 MED ORDER — CITALOPRAM HYDROBROMIDE 20 MG PO TABS
20 MG | ORAL_TABLET | Freq: Every day | ORAL | 1 refills | Status: DC
Start: 2020-03-27 — End: 2020-08-22

## 2020-03-27 MED ORDER — PANTOPRAZOLE SODIUM 40 MG PO TBEC
40 MG | ORAL_TABLET | Freq: Every day | ORAL | 5 refills | Status: DC
Start: 2020-03-27 — End: 2020-04-12

## 2020-03-29 ENCOUNTER — Encounter: Attending: Family Medicine | Primary: Family Medicine

## 2020-03-29 LAB — PAP SMEAR

## 2020-04-10 ENCOUNTER — Telehealth

## 2020-04-12 MED ORDER — FAMOTIDINE 40 MG PO TABS
40 MG | ORAL_TABLET | Freq: Every evening | ORAL | 3 refills | Status: DC
Start: 2020-04-12 — End: 2020-08-30

## 2020-07-31 ENCOUNTER — Encounter: Attending: Family Medicine | Primary: Family Medicine

## 2020-08-08 ENCOUNTER — Ambulatory Visit
Admit: 2020-08-08 | Discharge: 2020-08-08 | Payer: BLUE CROSS/BLUE SHIELD | Attending: Family Medicine | Primary: Family Medicine

## 2020-08-08 DIAGNOSIS — J301 Allergic rhinitis due to pollen: Secondary | ICD-10-CM

## 2020-08-22 ENCOUNTER — Ambulatory Visit
Admit: 2020-08-22 | Discharge: 2020-08-22 | Payer: BLUE CROSS/BLUE SHIELD | Attending: Family Medicine | Primary: Family Medicine

## 2020-08-22 DIAGNOSIS — J302 Other seasonal allergic rhinitis: Secondary | ICD-10-CM

## 2020-08-22 MED ORDER — CITALOPRAM HYDROBROMIDE 20 MG PO TABS
20 | ORAL_TABLET | Freq: Every day | ORAL | 1 refills | Status: AC
Start: 2020-08-22 — End: ?

## 2020-08-22 MED ORDER — MECLIZINE HCL 25 MG PO TABS
25 MG | ORAL_TABLET | Freq: Four times a day (QID) | ORAL | 0 refills | Status: AC | PRN
Start: 2020-08-22 — End: 2020-09-01

## 2020-08-22 MED ORDER — PREDNISONE 20 MG PO TABS
20 MG | ORAL_TABLET | ORAL | 0 refills | Status: DC
Start: 2020-08-22 — End: 2020-08-30

## 2020-08-24 NOTE — Telephone Encounter (Signed)
error 

## 2020-08-25 ENCOUNTER — Encounter
Admit: 2020-08-25 | Discharge: 2020-08-25 | Payer: BLUE CROSS/BLUE SHIELD | Attending: Family Medicine | Primary: Family Medicine

## 2020-08-25 DIAGNOSIS — J302 Other seasonal allergic rhinitis: Secondary | ICD-10-CM

## 2020-08-25 MED ORDER — METHYLPREDNISOLONE ACETATE 40 MG/ML IJ SUSP
40 MG/ML | Freq: Once | INTRAMUSCULAR | Status: AC
Start: 2020-08-25 — End: 2020-08-25
  Administered 2020-08-25: 14:00:00 40 mg via INTRAMUSCULAR

## 2020-08-25 MED ORDER — METHYLPREDNISOLONE ACETATE 80 MG/ML IJ SUSP
80 MG/ML | Freq: Once | INTRAMUSCULAR | Status: AC
Start: 2020-08-25 — End: 2020-08-25
  Administered 2020-08-25: 14:00:00 80 mg via INTRAMUSCULAR

## 2020-08-30 ENCOUNTER — Ambulatory Visit
Admit: 2020-08-30 | Discharge: 2020-08-30 | Payer: BLUE CROSS/BLUE SHIELD | Attending: Family Medicine | Primary: Family Medicine

## 2020-08-30 DIAGNOSIS — H8113 Benign paroxysmal vertigo, bilateral: Secondary | ICD-10-CM

## 2020-08-31 ENCOUNTER — Telehealth

## 2020-08-31 NOTE — Telephone Encounter (Signed)
Pt called to inform Dr. Rennis Petty the specialist she would like to see. For her Allergist, [facility]. Physical therapy- Darrel Reach Phone # [phone] Fax# [phone]

## 2020-09-01 ENCOUNTER — Inpatient Hospital Stay: Admit: 2020-09-01 | Payer: BLUE CROSS/BLUE SHIELD | Primary: Family Medicine

## 2020-09-01 DIAGNOSIS — H8113 Benign paroxysmal vertigo, bilateral: Secondary | ICD-10-CM

## 2020-09-25 ENCOUNTER — Encounter: Attending: Family Medicine | Primary: Family Medicine

## 2020-12-11 ENCOUNTER — Encounter: Attending: Family Medicine | Primary: Family Medicine

## 2021-08-30 ENCOUNTER — Emergency Department (HOSPITAL_COMMUNITY)
Admission: EM | Admit: 2021-08-30 | Discharge: 2021-08-31 | Disposition: A | Discharge status: Home / Self Care | Payer: Self-pay | Attending: Emergency Medicine | Admitting: Emergency Medicine

## 2021-08-30 ENCOUNTER — Encounter (HOSPITAL_COMMUNITY): Payer: Self-pay

## 2021-08-30 ENCOUNTER — Other Ambulatory Visit: Payer: Self-pay

## 2021-08-30 DIAGNOSIS — I10 Essential (primary) hypertension: Secondary | ICD-10-CM | POA: Insufficient documentation

## 2021-08-30 DIAGNOSIS — M791 Myalgia, unspecified site: Secondary | ICD-10-CM | POA: Insufficient documentation

## 2021-08-30 DIAGNOSIS — R5383 Other fatigue: Secondary | ICD-10-CM | POA: Insufficient documentation

## 2021-08-30 DIAGNOSIS — Z87891 Personal history of nicotine dependence: Secondary | ICD-10-CM | POA: Insufficient documentation

## 2021-08-30 DIAGNOSIS — R52 Pain, unspecified: Secondary | ICD-10-CM

## 2021-08-30 DIAGNOSIS — R1033 Periumbilical pain: Secondary | ICD-10-CM | POA: Insufficient documentation

## 2021-08-30 LAB — URINALYSIS, ROUTINE W REFLEX MICROSCOPIC
Glucose, UA: NEGATIVE mg/dL
Nitrite: NEGATIVE
Protein, ur: 30 mg/dL — AB
Specific Gravity, Urine: >1.03 — ABNORMAL HIGH (ref 1.005–1.030)
pH: 5.5 (ref 5.0–8.0)

## 2021-08-30 LAB — URINALYSIS, MICROSCOPIC (REFLEX)
Bacteria, UA: NONE SEEN
RBC / HPF: >50 RBC/hpf (ref 0–5)

## 2021-08-30 LAB — CBG MONITORING, ED: Glucose-Capillary: 137 mg/dL — ABNORMAL HIGH (ref 70–99)

## 2021-08-30 MED ORDER — SODIUM CHLORIDE 0.9 % IV BOLUS
1000.0000 mL | Freq: Once | INTRAVENOUS | Status: AC
  Administered 2021-08-31: 1000 mL via INTRAVENOUS

## 2021-08-30 MED ORDER — ONDANSETRON HCL 4 MG/2ML IJ SOLN
4.0000 mg | Freq: Once | INTRAMUSCULAR | Status: AC
  Administered 2021-08-31: 4 mg via INTRAVENOUS
  Filled 2021-08-30: qty 2

## 2021-08-30 NOTE — ED Provider Notes (Signed)
[facility]-EMERGENCY DEPT Provider Note   CSN: [id number] Arrival date & time: 08/30/21  1704     History Chief Complaint  Patient presents with   Generalized Body Aches   Fatigue    Tracy Weaver is a 35 y.o. female.  Patient presents today with generalized body aches. 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 She also felt febrile during the episode, however did not check her temperature and has not felt febrile since. Denies any episodes of this since, has felt fatigued with body aches since. Of note, patient has history of gallbladder disease without cholecystectomy, patient is very concerned that this could be the source of her symptoms.  The history is provided by the patient. No language interpreter was used.      Past Medical History:  Diagnosis Date   Abscess    Gallbladder disease    Hypertension     Patient Active Problem List   Diagnosis Date Noted   Sepsis (HCC) 12/27/2017   Abscess of right buttock 12/26/2017   Gallstones with obstruction of gallbladder 09/27/2013   Fatty liver disease, nonalcoholic 09/27/2013   Obesity 78/46/9629   Gallstones without obstruction of gallbladder 09/27/2013    Past Surgical History:  Procedure Laterality Date   IRRIGATION AND DEBRIDEMENT BUTTOCKS Right 12/27/2017   Procedure: IRRIGATION AND DEBRIDEMENT RIGHT BUTTOCKS;  Surgeon: Harriette Bouillon, MD;  Location: [facility] OR;  Service: General;  Laterality: Right;     OB History   No obstetric history on file.     History reviewed. No pertinent family history.  Social History   Tobacco Use   Smoking status: Former    Packs/day: 0.50    Types: Cigarettes    Quit date: 03/19/2015    Years since quitting: 6.4   Smokeless tobacco: Never  Substance Use Topics   Alcohol use: No    Comment: occ   Drug use: No    Home Medications Prior  to Admission medications   Medication Sig Start Date End Date Taking? Authorizing Provider  HYDROcodone-acetaminophen (NORCO/VICODIN) 5-325 MG tablet Take 1 tablet by mouth every 4 (four) hours as needed for severe pain. 12/29/17   Meuth, Lina Sar, PA-C  metFORMIN (GLUCOPHAGE) 500 MG tablet Take 1 tablet (500 mg total) by mouth 2 (two) times daily with a meal. 12/29/17   Meuth, Brooke A, PA-C    Allergies    Penicillins  Review of Systems   Review of Systems  Constitutional:  Positive for fatigue. Negative for chills, diaphoresis and fever.  HENT:  Negative for congestion, postnasal drip, rhinorrhea and sinus pain.   Respiratory:  Negative for chest tightness and shortness of breath.   Cardiovascular:  Negative for chest pain, palpitations and leg swelling.  Gastrointestinal:  Positive for abdominal pain, diarrhea, nausea and vomiting. Negative for abdominal distention, blood in stool, constipation and rectal pain.  All other systems reviewed and are negative.  Physical Exam Updated Vital Signs BP (!) 159/116 (BP Location: Right Arm)   Pulse 92   Temp 98.6 F (37 C) (Oral)   Resp 16   Ht 5\' 8"  (1.727 m)   Wt 104 kg   SpO2 100%   BMI 34.86 kg/m   Physical Exam Vitals and nursing note reviewed.  Constitutional:      General: She is not in acute distress.    Appearance:  Normal appearance. She is not ill-appearing, toxic-appearing or diaphoretic.  HENT:     Head: Normocephalic.     Nose: No congestion or rhinorrhea.  Cardiovascular:     Rate and Rhythm: Normal rate and regular rhythm.     Pulses: Normal pulses.     Heart sounds: Normal heart sounds.  Pulmonary:     Effort: Pulmonary effort is normal.     Breath sounds: Normal breath sounds.  Abdominal:     General: Abdomen is flat. Bowel sounds are normal. There is no distension. There are no signs of injury.     Palpations: Abdomen is soft.     Tenderness: There is abdominal tenderness in the periumbilical area. There is no  guarding or rebound. Negative signs include Murphy's sign, Rovsing's sign, McBurney's sign, psoas sign and obturator sign.     Comments: Some mild tenderness noted in the periumbilical area to deep palpation. Peritoneal signs negative.  Musculoskeletal:        General: Normal range of motion.     Cervical back: Normal range of motion and neck supple.  Skin:    General: Skin is warm and dry.  Neurological:     General: No focal deficit present.     Mental Status: She is alert.  Psychiatric:        Mood and Affect: Mood normal.        Behavior: Behavior normal.    ED Results / Procedures / Treatments   Labs (all labs ordered are listed, but only abnormal results are displayed) Labs Reviewed  CBG MONITORING, ED - Abnormal; Notable for the following components:      Result Value   Glucose-Capillary 137 (*)    All other components within normal limits  RESP PANEL BY RT-PCR (FLU A&B, COVID) ARPGX2  BASIC METABOLIC PANEL  CBC WITH DIFFERENTIAL/PLATELET  URINALYSIS, ROUTINE W REFLEX MICROSCOPIC    EKG None  Radiology US Abdomen Limited  Result Date: 08/31/2021 CLINICAL DATA:  Right upper quadrant pain EXAM: ULTRASOUND ABDOMEN LIMITED RIGHT UPPER QUADRANT COMPARISON:  12/09/2013 FINDINGS: Gallbladder: Gallstones fill the gallbladder. No visible wall thickening. Negative sonographic Murphy's. Common bile duct: Diameter: Normal caliber, 5 mm Liver: Increased echotexture compatible with fatty infiltration. No focal abnormality or biliary ductal dilatation. Portal vein is patent on color Doppler imaging with normal direction of blood flow towards the liver. Other: None. IMPRESSION: Gallstones fill the gallbladder. No sonographic evidence of acute cholecystitis. Hepatic steatosis. Electronically Signed   By: Charlett Nose M.D.   On: 08/31/2021 00:29    Procedures Procedures   Medications Ordered in ED Medications - No data to display  ED Course  I have reviewed the triage vital signs and  the nursing notes.  Pertinent labs & imaging results that were available during my care of the patient were reviewed by me and considered in my medical decision making (see chart for details).  Clinical Course as of 08/31/21 0258  Thu Aug 30, 2021  2352 Pt refusing COVID [HM]  Fri Aug 31, 2021  0128 Hgb urine dipstick(!): LARGE Pt currently menstruating [HM]    Clinical Course User Index [HM] Muthersbaugh, Boyd Kerbs   MDM Rules/Calculators/A&P                         Patient presents to the ED with complaints of generalized body aches. Nontoxic, vitals unremarkable.    Additional history obtained:  Additional history obtained from chart review & nursing note  review.   Lab Tests:  I Ordered, reviewed, and interpreted labs, which included:  CBC, CMP, hCG, Lipase, UA Labs unremarkable for leukocytosis, anemia, electrolyte abnormalities, liver or gallbladder pathology. Urinalysis remarkable for blood, however patient is on her menstrual cycle. Denies dysuria or polyuria  Imaging Studies ordered:  I ordered imaging studies which included US abdomen, I independently reviewed, formal radiology impression shows:  Gallstones fill gallbladder, no signs of cholecystitis  ED Course:  Patient presents today for generalized body aches following episode of nausea, vomiting, and diarrhea. No longer nauseous, vomiting, or having diarrhea in last 24 hours. Labs and imaging not concerning for cholecystitis, choledocholithiasis, pancreatitis, appendicitis, or acute abdomen. Patient refused COVID rule out. She is afebrile and non-toxic appearing. Patient mildly tender in hypogastric area likely due to viral process. Patient given fluids for management. Patient also noted to be hypertensive with no history of this. Potentially white coat hypertension given patient endorses feeling anxious tonight. Advised home blood pressure checks with follow-up with primary care in 1 week for management of this as  well as to discuss further diabetes management. Patient is feeling better at time of discharge, amenable with discharge plan and given strict instructions of when to return.  Portions of this note were generated with Scientist, clinical (histocompatibility and immunogenetics). Dictation errors may occur despite best attempts at proofreading.   Final Clinical Impression(s) / ED Diagnoses Final diagnoses:  Generalized body aches    Rx / DC Orders ED Discharge Orders     None     An After Visit Summary was printed and given to the patient.    Silva Bandy, PA-C 08/31/21 0335    Sloan Leiter, DO 09/01/21 0011

## 2021-08-30 NOTE — ED Triage Notes (Signed)
Pt reports bodyaches, fatigue, nausea, headache x 1 day

## 2021-08-30 NOTE — ED Provider Notes (Signed)
Emergency Medicine Provider Triage Evaluation Note  Tracy Weaver , a 35 y.o. female  was evaluated in triage.  Pt complains of vomiting, general weakness, and diarrhea.  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  States she felt she was superhot yesterday and did not feel well.  No other symptoms. Takes Metformin.    Review of Systems  Positive:  Negative: See above  Physical Exam  BP (!) 197/122 (BP Location: Right Arm)   Pulse 94   Temp 98.6 F (37 C) (Oral)   Resp 16   Ht 5\' 8"  (1.727 m)   Wt 104 kg   SpO2 100%   BMI 34.86 kg/m  Gen:   Awake, no distress   Resp:  Normal effort  MSK:   Moves extremities without difficulty  Other:    Medical Decision Making  Medically screening exam initiated at 5:34 PM.  Appropriate orders placed.  [name] was informed that the remainder of the evaluation will be completed by another provider, this initial triage assessment does not replace that evaluation, and the importance of remaining in the ED until their evaluation is complete.     Jeri Modena Riner, PA-C 08/30/21 1736    10/30/21, MD 09/01/21 [phone]

## 2021-08-30 NOTE — ED Provider Notes (Signed)
Pt seen in conjunction with PA-C Smoot.  [name] is a 34 y.o. female presents to the Emergency room with c/o of N/V/D and generally feeling unwell.  Pt reports subjective fevers.  No known sick contacts.  No COVID testing.  Pt reports hx of gallstones. She was supposed to have it removed in 2014 but has not had this done yet.  Some foods irritate her abd, but this episode was not related to food intake. Pt reports NBNB emesis and no bloody diarrhea.     Face to face Exam:   BP (!) 174/103   Pulse 95   Temp 98 F (36.7 C) (Oral)   Resp 18   Ht 5\' 8"  (1.727 m)   Wt 104 kg   SpO2 100%   BMI 34.86 kg/m    General: Awake  HEENT: Atraumatic  Resp: Normal effort  Abd: Nondistended, soft, nontender  Neuro:No focal weakness  Lymph: No adenopathy Psyc: Anxious   Will obtain labs, Abdominal , UA. Symptomatic therapy with fluids and zofran. Pt refuses COVID/Flu test.   12:30 AM  Angiocath insertion Performed by: [place]  Consent: Verbal consent obtained. Risks and benefits: risks, benefits and alternatives were discussed Time out: Immediately prior to procedure a "time out" was called to verify the correct patient, procedure, equipment, support staff and site/side marked as required.  Preparation: Patient was prepped and draped in the usual sterile fashion.  Vein Location: right hand  Not Ultrasound Guided  Gauge: 22ga  Normal blood return and flush without difficulty Patient tolerance: Patient tolerated the procedure well with no immediate complications.  3:23 AM Pt feeling better.  Labs reassuring. Will have PCP follow-up.      Toure Edmonds, Dierdre Forth 08/31/21 0324    10/31/21, MD 09/01/21 [phone]

## 2021-08-31 ENCOUNTER — Emergency Department (HOSPITAL_COMMUNITY): Payer: Self-pay

## 2021-08-31 ENCOUNTER — Other Ambulatory Visit (HOSPITAL_COMMUNITY): Payer: Self-pay

## 2021-08-31 LAB — CBC WITH DIFFERENTIAL/PLATELET
Abs Immature Granulocytes: 0.05 K/uL (ref 0.00–0.07)
Basophils Absolute: 0 K/uL (ref 0.0–0.1)
Basophils Relative: 0 %
Eosinophils Absolute: 0.3 K/uL (ref 0.0–0.5)
Eosinophils Relative: 3 %
HCT: 43 % (ref 36.0–46.0)
Hemoglobin: 14 g/dL (ref 12.0–15.0)
Immature Granulocytes: 1 %
Lymphocytes Relative: 28 %
Lymphs Abs: 2.9 K/uL (ref 0.7–4.0)
MCH: 29.2 pg (ref 26.0–34.0)
MCHC: 32.6 g/dL (ref 30.0–36.0)
MCV: 89.6 fL (ref 80.0–100.0)
Monocytes Absolute: 0.7 K/uL (ref 0.1–1.0)
Monocytes Relative: 7 %
Neutro Abs: 6.3 K/uL (ref 1.7–7.7)
Neutrophils Relative %: 61 %
Platelets: 275 K/uL (ref 150–400)
RBC: 4.8 MIL/uL (ref 3.87–5.11)
RDW: 13.1 % (ref 11.5–15.5)
WBC: 10.3 K/uL (ref 4.0–10.5)
nRBC: 0 % (ref 0.0–0.2)

## 2021-08-31 LAB — COMPREHENSIVE METABOLIC PANEL WITH GFR
ALT: 15 U/L (ref 0–44)
AST: 18 U/L (ref 15–41)
Albumin: 3.9 g/dL (ref 3.5–5.0)
Alkaline Phosphatase: 55 U/L (ref 38–126)
Anion gap: 7 (ref 5–15)
BUN: 10 mg/dL (ref 6–20)
CO2: 23 mmol/L (ref 22–32)
Calcium: 8.2 mg/dL — ABNORMAL LOW (ref 8.9–10.3)
Chloride: 106 mmol/L (ref 98–111)
Creatinine, Ser: 0.42 mg/dL — ABNORMAL LOW (ref 0.44–1.00)
GFR, Estimated: >60 mL/min
Glucose, Bld: 128 mg/dL — ABNORMAL HIGH (ref 70–99)
Potassium: 4.1 mmol/L (ref 3.5–5.1)
Sodium: 136 mmol/L (ref 135–145)
Total Bilirubin: 0.5 mg/dL (ref 0.3–1.2)
Total Protein: 7.1 g/dL (ref 6.5–8.1)

## 2021-08-31 LAB — I-STAT BETA HCG BLOOD, ED (MC, WL, AP ONLY): I-stat hCG, quantitative: <5 m[IU]/mL

## 2021-08-31 LAB — LIPASE, BLOOD: Lipase: 30 U/L (ref 11–51)

## 2021-08-31 NOTE — Discharge Instructions (Signed)
Please follow-up with primary care in the next week to discuss further hypertension and diabetes management. Return if symptoms worsen

## 2022-06-02 IMAGING — US US ABDOMEN LIMITED
1 series · 15 of 25 positions shown · non-contrast
Comparison: 12/09/2013

CLINICAL DATA: Right upper quadrant pain

EXAM:
ULTRASOUND ABDOMEN LIMITED RIGHT UPPER QUADRANT

[Series 1: us abdomen limited mc & wl · 15 of 47 slices shown]
[im 1/47]
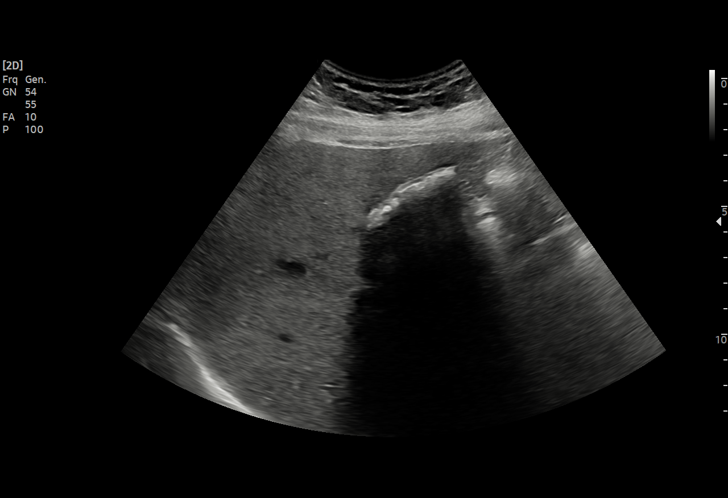
[im 4/47]
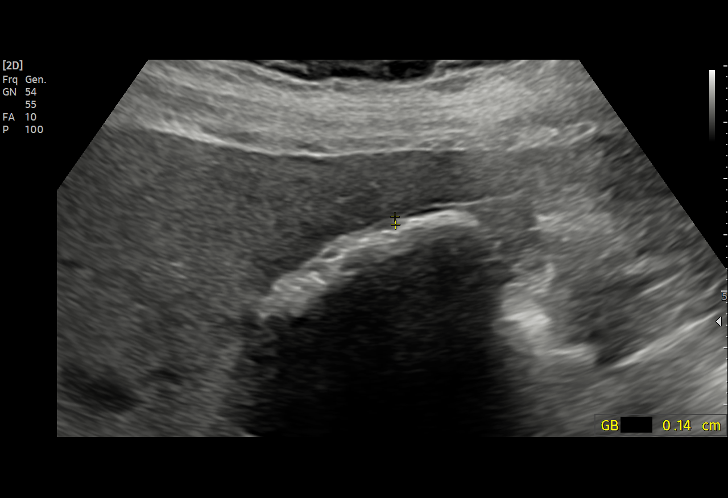
[im 8/47]
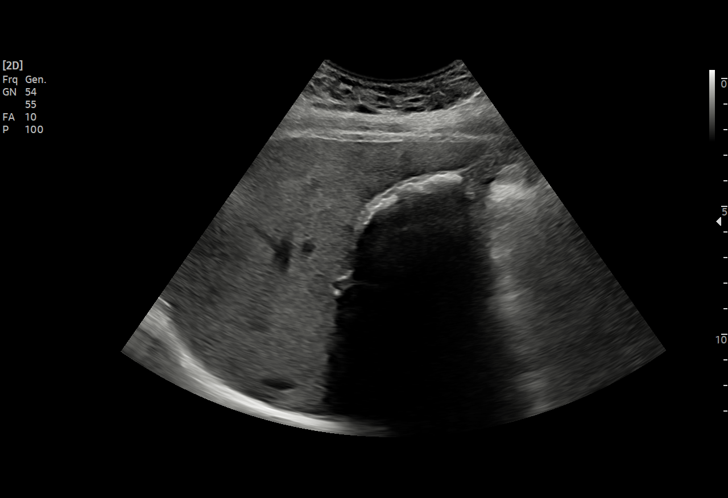
[im 10/47]
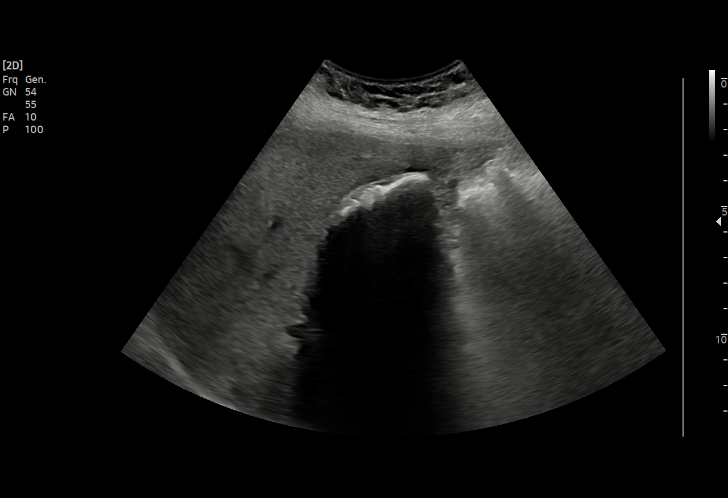
[im 14/47]
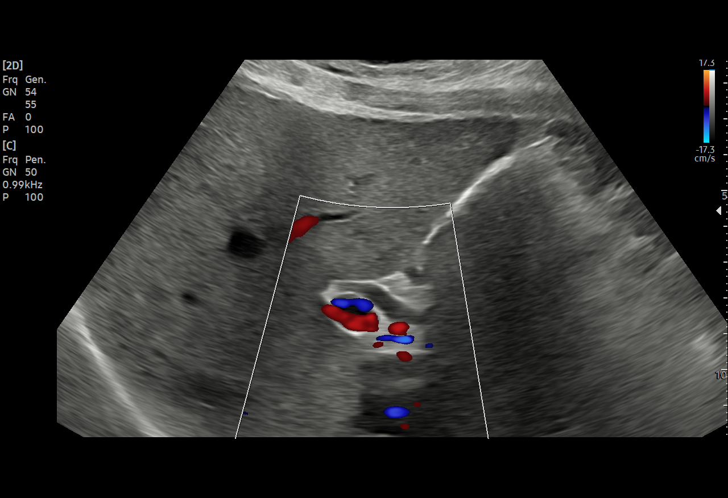
[im 18/47]
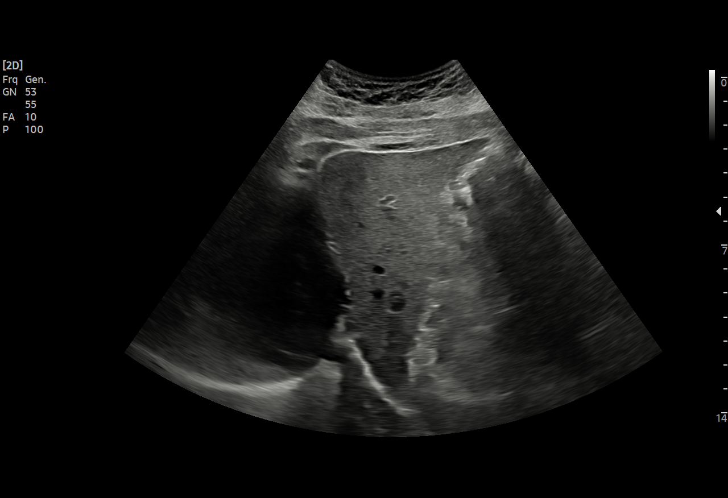
[im 20/47]
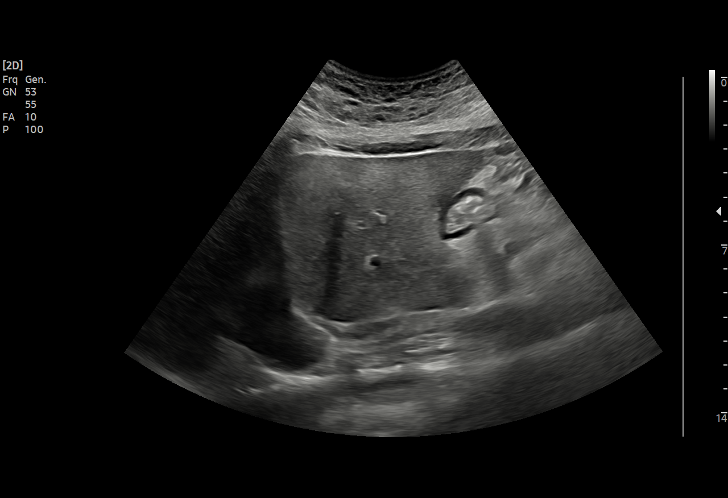
[im 24/47]
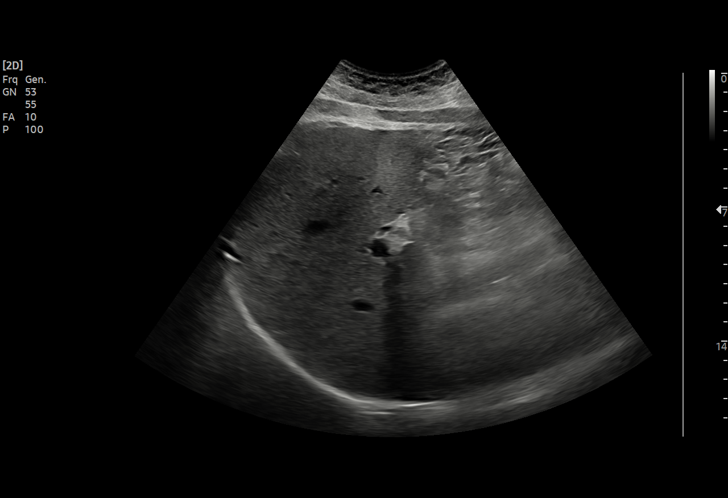
[im 27/47]
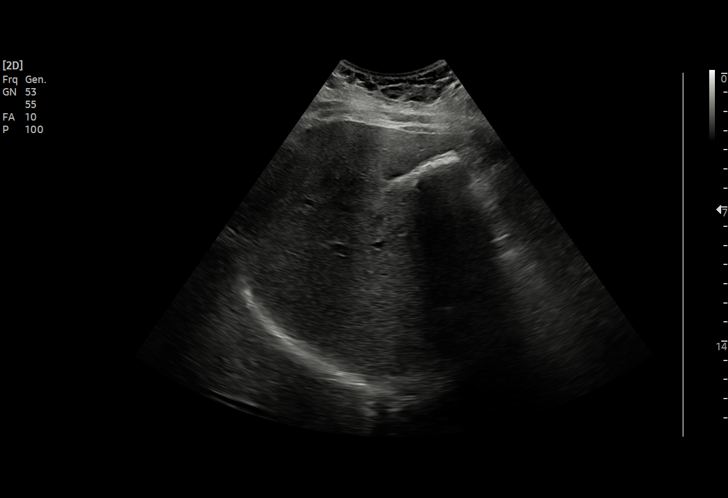
[im 29/47]
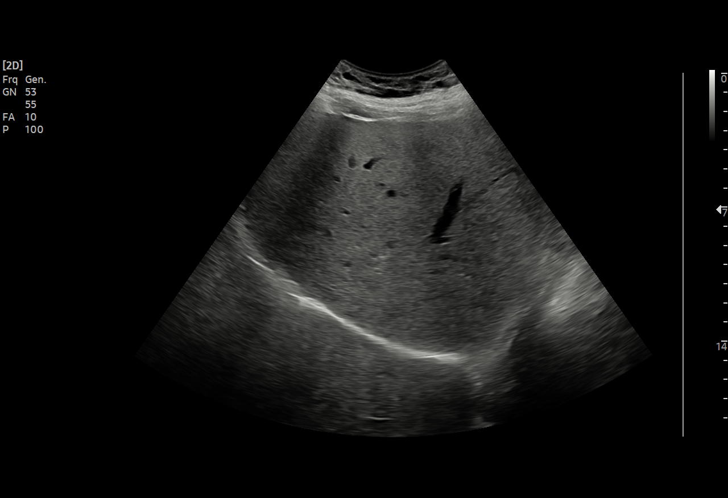
[im 33/47]
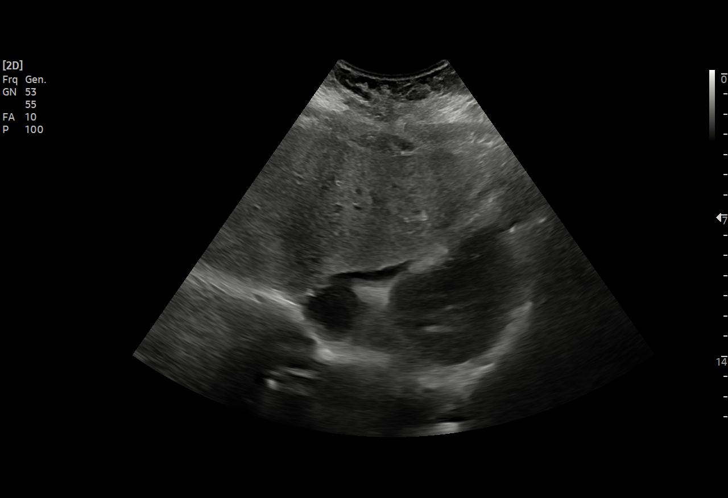
[im 37/47]
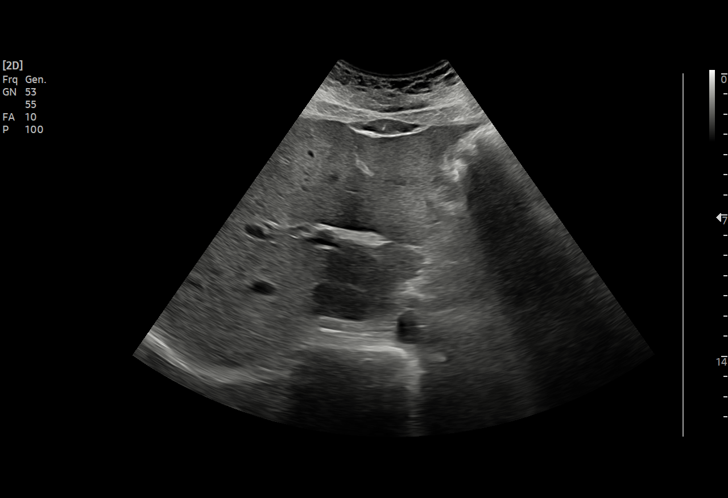
[im 39/47]
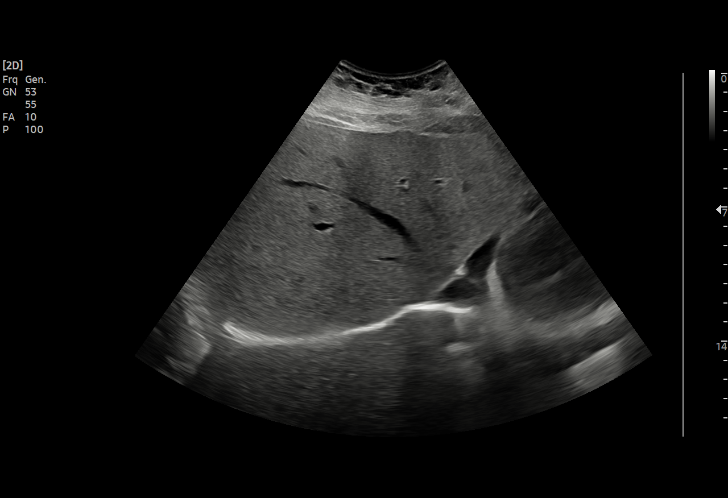
[im 43/47]
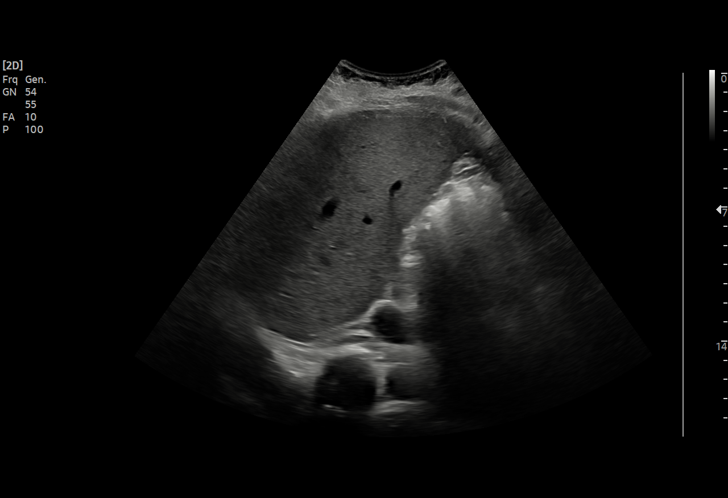
[im 47/47]
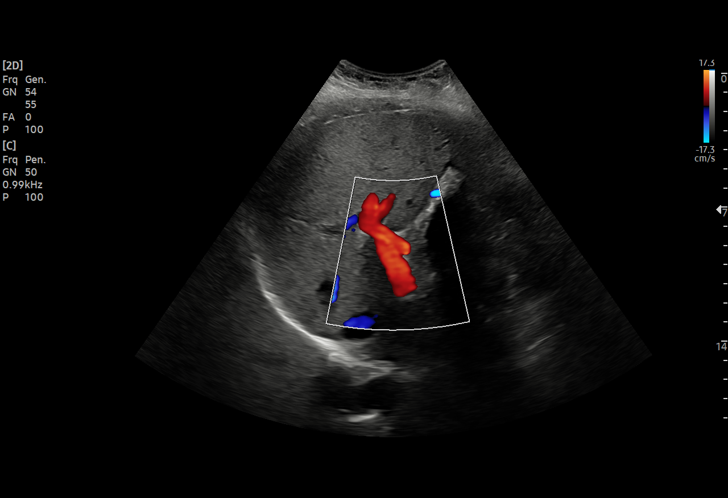

[15 of 25 positions shown; findings below may reference images not displayed]

FINDINGS: Gallbladder:

Gallstones fill the gallbladder. No visible wall thickening.
Negative sonographic Amra.

Common bile duct:

Diameter: Normal caliber, 5 mm

Liver:

Increased echotexture compatible with fatty infiltration. No focal
abnormality or biliary ductal dilatation. Portal vein is patent on
color Doppler imaging with normal direction of blood flow towards
the liver.

Other: None.
IMPRESSION: Gallstones fill the gallbladder. No sonographic evidence of acute
cholecystitis.

Hepatic steatosis.

## 2023-09-21 ENCOUNTER — Inpatient Hospital Stay (HOSPITAL_COMMUNITY): Payer: MEDICAID

## 2023-09-21 ENCOUNTER — Other Ambulatory Visit: Payer: Self-pay

## 2023-09-21 ENCOUNTER — Encounter (HOSPITAL_COMMUNITY): Payer: Self-pay

## 2023-09-21 ENCOUNTER — Inpatient Hospital Stay (HOSPITAL_COMMUNITY)
Admission: AD | Admit: 2023-09-21 | Discharge: 2023-09-21 | Disposition: A | Discharge status: Home / Self Care | Payer: MEDICAID | Attending: Obstetrics and Gynecology | Admitting: Obstetrics and Gynecology

## 2023-09-21 DIAGNOSIS — O26891 Other specified pregnancy related conditions, first trimester: Secondary | ICD-10-CM

## 2023-09-21 DIAGNOSIS — O10911 Unspecified pre-existing hypertension complicating pregnancy, first trimester: Secondary | ICD-10-CM | POA: Insufficient documentation

## 2023-09-21 DIAGNOSIS — O209 Hemorrhage in early pregnancy, unspecified: Secondary | ICD-10-CM

## 2023-09-21 DIAGNOSIS — O3680X0 Pregnancy with inconclusive fetal viability, not applicable or unspecified: Secondary | ICD-10-CM | POA: Insufficient documentation

## 2023-09-21 DIAGNOSIS — Z87891 Personal history of nicotine dependence: Secondary | ICD-10-CM | POA: Insufficient documentation

## 2023-09-21 DIAGNOSIS — Z3A00 Weeks of gestation of pregnancy not specified: Secondary | ICD-10-CM | POA: Insufficient documentation

## 2023-09-21 DIAGNOSIS — E11649 Type 2 diabetes mellitus with hypoglycemia without coma: Secondary | ICD-10-CM | POA: Insufficient documentation

## 2023-09-21 DIAGNOSIS — Z79899 Other long term (current) drug therapy: Secondary | ICD-10-CM | POA: Insufficient documentation

## 2023-09-21 DIAGNOSIS — Z7984 Long term (current) use of oral hypoglycemic drugs: Secondary | ICD-10-CM | POA: Insufficient documentation

## 2023-09-21 DIAGNOSIS — O24111 Pre-existing diabetes mellitus, type 2, in pregnancy, first trimester: Secondary | ICD-10-CM | POA: Insufficient documentation

## 2023-09-21 DIAGNOSIS — D219 Benign neoplasm of connective and other soft tissue, unspecified: Secondary | ICD-10-CM

## 2023-09-21 DIAGNOSIS — O200 Threatened abortion: Secondary | ICD-10-CM | POA: Insufficient documentation

## 2023-09-21 HISTORY — DX: Type 2 diabetes mellitus without complications: E11.9

## 2023-09-21 LAB — CBC
HCT: 44 % (ref 36.0–46.0)
Hemoglobin: 14.2 g/dL (ref 12.0–15.0)
MCH: 27.4 pg (ref 26.0–34.0)
MCHC: 32.3 g/dL (ref 30.0–36.0)
MCV: 84.9 fL (ref 80.0–100.0)
Platelets: 272 10*3/uL (ref 150–400)
RBC: 5.18 MIL/uL — ABNORMAL HIGH (ref 3.87–5.11)
RDW: 13 % (ref 11.5–15.5)
WBC: 8.8 10*3/uL (ref 4.0–10.5)
nRBC: 0 % (ref 0.0–0.2)

## 2023-09-21 LAB — COMPREHENSIVE METABOLIC PANEL
ALT: 38 U/L (ref 0–44)
AST: 32 U/L (ref 15–41)
Albumin: 4.1 g/dL (ref 3.5–5.0)
Alkaline Phosphatase: 84 U/L (ref 38–126)
Anion gap: 15 (ref 5–15)
BUN: 9 mg/dL (ref 6–20)
CO2: 22 mmol/L (ref 22–32)
Calcium: 9.4 mg/dL (ref 8.9–10.3)
Chloride: 97 mmol/L — ABNORMAL LOW (ref 98–111)
Creatinine, Ser: 0.82 mg/dL (ref 0.44–1.00)
GFR, Estimated: >60 mL/min (ref >60)
Glucose, Bld: 324 mg/dL — ABNORMAL HIGH (ref 70–99)
Potassium: 4.3 mmol/L (ref 3.5–5.1)
Sodium: 134 mmol/L — ABNORMAL LOW (ref 135–145)
Total Bilirubin: 0.2 mg/dL — ABNORMAL LOW (ref 0.3–1.2)
Total Protein: 7.6 g/dL (ref 6.5–8.1)

## 2023-09-21 LAB — HEMOGLOBIN A1C
Hgb A1c MFr Bld: 11.6 % — ABNORMAL HIGH (ref 4.8–5.6)
Mean Plasma Glucose: 286.22 mg/dL

## 2023-09-21 LAB — RPR: RPR Ser Ql: NONREACTIVE

## 2023-09-21 LAB — HIV ANTIBODY (ROUTINE TESTING W REFLEX): HIV Screen 4th Generation wRfx: NONREACTIVE

## 2023-09-21 LAB — WET PREP, GENITAL
Sperm: NONE SEEN
Trich, Wet Prep: NONE SEEN
WBC, Wet Prep HPF POC: >=10 — AB (ref <10)
Yeast Wet Prep HPF POC: NONE SEEN

## 2023-09-21 LAB — GLUCOSE, CAPILLARY: Glucose-Capillary: 304 mg/dL — ABNORMAL HIGH (ref 70–99)

## 2023-09-21 LAB — ABO/RH: ABO/RH(D): B POS

## 2023-09-21 LAB — HCG, QUANTITATIVE, PREGNANCY: hCG, Beta Chain, Quant, S: 68 m[IU]/mL — ABNORMAL HIGH (ref <5)

## 2023-09-21 MED ORDER — NIFEDIPINE ER OSMOTIC RELEASE 30 MG PO TB24
30.0000 mg | ORAL_TABLET | Freq: Every day | ORAL | Status: DC
  Administered 2023-09-21: 30 mg via ORAL
  Filled 2023-09-21: qty 1

## 2023-09-21 MED ORDER — METFORMIN HCL 500 MG PO TABS
1000.0000 mg | ORAL_TABLET | Freq: Two times a day (BID) | ORAL | 6 refills | Status: AC
Start: 2023-09-21 — End: ?

## 2023-09-21 MED ORDER — ONDANSETRON 4 MG PO TBDP
4.0000 mg | ORAL_TABLET | Freq: Once | ORAL | Status: AC
  Administered 2023-09-21: 4 mg via ORAL
  Filled 2023-09-21: qty 1

## 2023-09-21 MED ORDER — NIFEDIPINE ER 30 MG PO TB24: 30.0000 mg | ORAL_TABLET | Freq: Every day | ORAL | 6 refills | Status: AC

## 2023-09-21 NOTE — MAU Note (Signed)
.  Tracy Weaver is a 37 y.o. at Unknown here in MAU reporting: 5 pos hpt last week, pt states she is unsure of LMP but normally has regular cycles. Pt states she woke up to pink spotting this morning that has progressively gotten heavier and is now bright red.  Also reports some moderate cramping.  Denies recent intercourse.   Onset of complaint: 09/29 Pain score: 5/10 There were no vitals filed for this visit.

## 2023-09-21 NOTE — MAU Provider Note (Signed)
Chief Complaint: Vaginal Bleeding and Abdominal Pain   First Provider Initiated Contact with Patient 09/21/2023 at 0932.   SUBJECTIVE HPI: Tracy Weaver is a 37 y.o. G1P0 at Unknown who presents to Maternity Admissions reporting:  Vaginal Bleeding: *** Passage of tissue or clots: *** Dizziness: ***  B POS  Pain Location: *** Quality: *** Severity: ***/10 on pain scale Duration: *** Course: *** Context: *** Timing: *** Modifying factors: *** Associated signs and symptoms: ***  Past Medical History:  Diagnosis Date  . Abscess   . Gallbladder disease   . Hypertension    OB History  Gravida Para Term Preterm AB Living  1            SAB IAB Ectopic Multiple Live Births               # Outcome Date GA Lbr Len/2nd Weight Sex Type Anes PTL Lv  1 Current            Past Surgical History:  Procedure Laterality Date  . IRRIGATION AND DEBRIDEMENT BUTTOCKS Right 12/27/2017   Procedure: IRRIGATION AND DEBRIDEMENT RIGHT BUTTOCKS;  Surgeon: Harriette Bouillon, MD;  Location: [facility];  Service: General;  Laterality: Right;   Social History   Socioeconomic History  . Marital status: Single    Spouse name: Not on file  . Number of children: Not on file  . Years of education: Not on file  . Highest education level: Not on file  Occupational History  . Not on file  Tobacco Use  . Smoking status: Former    Current packs/day: 0.00    Types: Cigarettes    Quit date: 03/19/2015    Years since quitting: 8.5  . Smokeless tobacco: Never  Substance and Sexual Activity  . Alcohol use: No    Comment: occ  . Drug use: No  . Sexual activity: Yes  Other Topics Concern  . Not on file  Social History Narrative  . Not on file   Social Determinants of Health   Financial Resource Strain: Not on file  Food Insecurity: Not on file  Transportation Needs: Not on file  Physical Activity: Not on file  Stress: Not on file  Social Connections: Not on file  Intimate Partner Violence: Not on  file   No current facility-administered medications on file prior to encounter.   Current Outpatient Medications on File Prior to Encounter  Medication Sig Dispense Refill  . HYDROcodone-acetaminophen (NORCO/VICODIN) 5-325 MG tablet Take 1 tablet by mouth every 4 (four) hours as needed for severe pain. (Patient not taking: Reported on 08/31/2021) 20 tablet 0   Allergies  Allergen Reactions  . Penicillins Swelling    Childhood reaction Has patient had a PCN reaction causing immediate rash, facial/tongue/throat swelling, SOB or lightheadedness with hypotension: yes Has patient had a PCN reaction causing severe rash involving mucus membranes or skin necrosis: no Has patient had a PCN reaction that required hospitalization: no Has patient had a PCN reaction occurring within the last 10 years: no If all of the above answers are "NO", then may proceed with Cephalosporin use.     I have reviewed the past Medical Hx, Surgical Hx, Social Hx, Allergies and Medications.   Review of Systems  OBJECTIVE Patient Vitals for the past 24 hrs:  BP Temp Temp src Pulse Resp SpO2  09/21/23 1358 (!) 172/109 -- -- 96 -- --  09/21/23 0953 (!) 188/108 -- -- (!) 104 -- --  09/21/23 0902 (!) 197/106 98.4  F (36.9 C) Oral (!) 109 15 100 %   Constitutional: Well-developed, well-nourished female in no acute distress.  Cardiovascular: normal rate Respiratory: normal rate and effort.  GI: Abd soft, non-tender. Pos BS x 4 MS: Extremities nontender, no edema, normal ROM Neurologic: Alert and oriented x 4.  GU: Neg CVAT.  SPECULUM EXAM: NEFG, physiologic discharge, no blood noted, cervix clean  BIMANUAL: cervix ***; uterus normal size, no adnexal tenderness or masses.  No CMT.  LAB RESULTS Results for orders placed or performed during the hospital encounter of 09/21/23 (from the past 24 hour(s))  Comprehensive metabolic panel     Status: Abnormal   Collection Time: 09/21/23  9:23 AM  Result Value Ref Range    Sodium 134 (L) 135 - 145 mmol/L   Potassium 4.3 3.5 - 5.1 mmol/L   Chloride 97 (L) 98 - 111 mmol/L   CO2 22 22 - 32 mmol/L   Glucose, Bld 324 (H) 70 - 99 mg/dL   BUN 9 6 - 20 mg/dL   Creatinine, Ser 9.56 0.44 - 1.00 mg/dL   Calcium 9.4 8.9 - 21.3 mg/dL   Total Protein 7.6 6.5 - 8.1 g/dL   Albumin 4.1 3.5 - 5.0 g/dL   AST 32 15 - 41 U/L   ALT 38 0 - 44 U/L   Alkaline Phosphatase 84 38 - 126 U/L   Total Bilirubin 0.2 (L) 0.3 - 1.2 mg/dL   GFR, Estimated >08 >65 mL/min   Anion gap 15 5 - 15  HIV Antibody (routine testing w rflx)     Status: None   Collection Time: 09/21/23  9:32 AM  Result Value Ref Range   HIV Screen 4th Generation wRfx Non Reactive Non Reactive  RPR     Status: None   Collection Time: 09/21/23  9:32 AM  Result Value Ref Range   RPR Ser Ql NON REACTIVE NON REACTIVE  hCG, quantitative, pregnancy     Status: Abnormal   Collection Time: 09/21/23  9:32 AM  Result Value Ref Range   hCG, Beta Chain, Quant, S 68 (H) <5 mIU/mL  CBC     Status: Abnormal   Collection Time: 09/21/23  9:32 AM  Result Value Ref Range   WBC 8.8 4.0 - 10.5 K/uL   RBC 5.18 (H) 3.87 - 5.11 MIL/uL   Hemoglobin 14.2 12.0 - 15.0 g/dL   HCT 78.4 69.6 - 29.5 %   MCV 84.9 80.0 - 100.0 fL   MCH 27.4 26.0 - 34.0 pg   MCHC 32.3 30.0 - 36.0 g/dL   RDW 28.4 13.2 - 44.0 %   Platelets 272 150 - 400 K/uL   nRBC 0.0 0.0 - 0.2 %  ABO/Rh     Status: None   Collection Time: 09/21/23  9:32 AM  Result Value Ref Range   ABO/RH(D) B POS    No rh immune globuloin      NOT A RH IMMUNE GLOBULIN CANDIDATE, PT RH POSITIVE Performed at [facility], [address] [place], [place] [address]   Hemoglobin A1c     Status: Abnormal   Collection Time: 09/21/23  9:32 AM  Result Value Ref Range   Hgb A1c MFr Bld 11.6 (H) 4.8 - 5.6 %   Mean Plasma Glucose 286.22 mg/dL  Wet prep, genital     Status: Abnormal   Collection Time: 09/21/23  9:50 AM   Specimen: Cervix  Result Value Ref Range   Yeast Wet Prep  HPF POC  NONE SEEN NONE SEEN   Trich, Wet Prep NONE SEEN NONE SEEN   Clue Cells Wet Prep HPF POC PRESENT (A) NONE SEEN   WBC, Wet Prep HPF POC >=10 (A) <10   Sperm NONE SEEN   Glucose, capillary     Status: Abnormal   Collection Time: 09/21/23 10:59 AM  Result Value Ref Range   Glucose-Capillary 304 (H) 70 - 99 mg/dL    IMAGING US OB LESS THAN 14 WEEKS WITH OB TRANSVAGINAL  Result Date: 09/21/2023 CLINICAL DATA:  Heavy bleeding.  Beta HCG of 68. EXAM: OBSTETRIC <14 WK [place] AND TRANSVAGINAL OB US TECHNIQUE: Both transabdominal and transvaginal ultrasound examinations were performed for complete evaluation of the gestation as well as the maternal uterus, adnexal regions, and pelvic cul-de-sac. Transvaginal technique was performed to assess early pregnancy. COMPARISON:  CT pelvis dated 12/26/2017. FINDINGS: Intrauterine gestational sac: None Yolk sac:  Not Visualized. Embryo:  Not Visualized. Cardiac Activity: Not Visualized. Maternal uterus/adnexae: The uterus appears normal and the endometrium measures 4 mm in thickness. The right ovary appears normal. In the left adnexa there is a heterogeneous mass with areas of color flow measuring 6.2 x 5.1 x 5.1 cm. No adnexal mass was seen on prior CT. There is small volume free fluid. IMPRESSION: Heterogeneous mass in the left adnexa with no evidence of intrauterine pregnancy and a small amount of free fluid in the pelvis. These findings are nonspecific but could reflect ectopic pregnancy. These results were called by telephone at the time of interpretation on 09/21/2023 at 12:28 pm to provider [facility] , who verbally acknowledged these results. Electronically Signed   By: Romona Curls M.D.   On: 09/21/2023 12:34    MAU COURSE CBC, Quant, ABO/Rh, ultrasound, wet prep and GC/chlamydia culture, UA  MDM Pain and bleeding in early pregnancy with normal intrauterine pregnancy and hemodynamically stable.  Pain and bleeding in early pregnancy with pregnancy  of unknown anatomic location, but hemodynamically stable.  Plain and bleeding in early pregnancy with pregnancy of unknown anatomic location and hemodynamically unstable.  ASSESSMENT 1. Chronic hypertension complicating or reason for care during pregnancy, first trimester   2. Pregnancy with type 2 diabetes mellitus in first trimester   3. Uncontrolled type 2 diabetes mellitus with hypoglycemia without coma (HCC)   4. Vaginal bleeding in pregnancy, first trimester   5. Abdominal pain during pregnancy, first trimester   6. Pregnancy of unknown anatomic location   7. Threatened miscarriage     PLAN Discharge home in stable condition. *** precautions  Follow-up Information     Primary Care Provider Follow up.   Why: as soon as possible for management of hypertension adn diabetes.        [facility] Follow up on 09/23/2023.   Specialty: Obstetrics and Gynecology Why: For repeat blood work and blood pressure check Contact information: [address] [place] [address] [phone]        [facility] Follow up.   Specialty: Obstetrics and Gynecology Why: worsening abdominal pain or pregnancy emergencies Contact information: [address] [place] [address] [phone]               Allergies as of 09/21/2023       Reactions   Penicillins Swelling   Childhood reaction Has patient had a PCN reaction causing immediate rash, facial/tongue/throat swelling, SOB or lightheadedness with hypotension: yes Has patient had a PCN  reaction causing severe rash involving mucus membranes or skin necrosis: no Has patient had a PCN reaction that required hospitalization: no Has patient had a PCN reaction occurring within the last 10 years: no If all of the above answers are "NO", then may proceed with Cephalosporin use.        Medication List     STOP taking  these medications    HYDROcodone-acetaminophen 5-325 MG tablet Commonly known as: NORCO/VICODIN       TAKE these medications    metFORMIN 500 MG tablet Commonly known as: GLUCOPHAGE Take 2 tablets (1,000 mg total) by mouth 2 (two) times daily with a meal. Take 1 tablet (500 mg) twice a day for 7 days. Increase to 2 tablets (1000 mg) twice a day. What changed:  how much to take additional instructions   NIFEdipine 30 MG 24 hr tablet Commonly known as: ADALAT CC Take 1 tablet (30 mg total) by mouth daily. Start taking on: September 22, 2023         Katrinka Blazing [place], [place] 09/21/2023  2:24 PM  4

## 2023-09-22 ENCOUNTER — Other Ambulatory Visit: Payer: Self-pay | Admitting: Advanced Practice Midwife

## 2023-09-22 ENCOUNTER — Encounter (HOSPITAL_COMMUNITY): Payer: Self-pay | Admitting: Obstetrics and Gynecology

## 2023-09-22 DIAGNOSIS — O3680X0 Pregnancy with inconclusive fetal viability, not applicable or unspecified: Secondary | ICD-10-CM

## 2023-09-22 LAB — GC/CHLAMYDIA PROBE AMP (~~LOC~~) NOT AT ARMC
Chlamydia: NEGATIVE
Comment: NEGATIVE
Comment: NORMAL
Neisseria Gonorrhea: NEGATIVE

## 2023-09-23 ENCOUNTER — Other Ambulatory Visit (INDEPENDENT_AMBULATORY_CARE_PROVIDER_SITE_OTHER): Payer: Self-pay | Admitting: *Deleted

## 2023-09-23 VITALS — BP 162/98 | HR 98 | Ht 68.0 in | Wt 198.0 lb

## 2023-09-23 DIAGNOSIS — O3680X0 Pregnancy with inconclusive fetal viability, not applicable or unspecified: Secondary | ICD-10-CM

## 2023-09-23 DIAGNOSIS — Z3A00 Weeks of gestation of pregnancy not specified: Secondary | ICD-10-CM

## 2023-09-23 LAB — SURGICAL PATHOLOGY

## 2023-09-23 NOTE — Progress Notes (Signed)
Here for stat bhcg. States her bleeding is light now. She denies pain but was having cramping but tylenol relieved it. She has not taken Procardia today.  She denies headache. BP elevated x2. Discussed assessment and history with Dr. Alysia Penna. Ordered to change bhcg to non-stat.  Advised patient to take procardia as soon as possible today. Advised to take Procardia at least 2 hours before next office visit.  Advised will need to follow up with her PCP.  Advised we will contact her with results and plan of care within 2 or 3 business days. She voices understanding. Nancy Fetter

## 2023-09-24 ENCOUNTER — Telehealth: Payer: Self-pay | Admitting: General Practice

## 2023-09-24 DIAGNOSIS — I10 Essential (primary) hypertension: Secondary | ICD-10-CM

## 2023-09-24 DIAGNOSIS — E119 Type 2 diabetes mellitus without complications: Secondary | ICD-10-CM

## 2023-09-24 LAB — BETA HCG QUANT (REF LAB): hCG Quant: 26 m[IU]/mL

## 2023-09-24 NOTE — Telephone Encounter (Signed)
Patient called and left message on nurse voicemail line requesting a call back for results.   Reviewed bhcg results with Edd Arbour who states decreasing bhcg levels are indicative of SAB, patient needs no further trending of levels but does need follow up with PCP. Referral placed to [facility].   Called patient and reviewed results with her as well as to expect a period in another 4-6 weeks. Asked patient if she had a PCP and she states no, she was seen at [facility] but that's been years ago. Offered appt with [facility] for this Friday and she states she cannot make it due to work. Provided their phone number where she can schedule something more convenient. Patient verbalized understanding.

## 2023-09-26 ENCOUNTER — Ambulatory Visit: Payer: Self-pay | Admitting: Nurse Practitioner

## 2024-12-26 ENCOUNTER — Ambulatory Visit
Admission: EM | Admit: 2024-12-26 | Discharge: 2024-12-26 | Disposition: A | Discharge status: Home / Self Care | Attending: Student | Admitting: Student

## 2024-12-26 DIAGNOSIS — H6122 Impacted cerumen, left ear: Secondary | ICD-10-CM

## 2024-12-26 NOTE — Discharge Instructions (Addendum)
-   Do not use any drops or over-the-counter medications in your ear for the next 3 days.   -I also recommend keeping your ear dry, use a cottonball or earplug in the shower for 3 days - After 3 days, you can try over-the-counter drops to loosen up the wax. - I sent a referral to ear nose and throat.  They should call you in about 1 week, but if you do not hear from them, call the number on the front of this paperwork to schedule this appointment.

## 2024-12-26 NOTE — ED Triage Notes (Signed)
 Tracy Weaver presents with an earache in her left ear x 2 weeks. She states headaches and fever came first and she was left with an earache. Treated with Rexall ear drop relief with temporary relief.

## 2024-12-26 NOTE — ED Provider Notes (Signed)
 " [facility]    CSN: [id number] Arrival date & time: 12/26/24  1112      History   Chief Complaint No chief complaint on file.   HPI Tracy Weaver is a 39 y.o. female [place] presents with an earache in her left ear x 2 weeks. She states headaches and fever came first and she was left with an earache. Treated with Rexall ear drop relief with temporary relief.     HPI  Past Medical History:  Diagnosis Date   Abscess    Diabetes mellitus without complication (HCC)    Gallbladder disease    Hypertension     Patient Active Problem List   Diagnosis Date Noted   Sepsis (HCC) 12/27/2017   Abscess of right buttock 12/26/2017   Gallstones with obstruction of gallbladder 09/27/2013   Fatty liver disease, nonalcoholic 09/27/2013   Obesity 89/93/7985   Gallstones without obstruction of gallbladder 09/27/2013    Past Surgical History:  Procedure Laterality Date   IRRIGATION AND DEBRIDEMENT BUTTOCKS Right 12/27/2017   Procedure: IRRIGATION AND DEBRIDEMENT RIGHT BUTTOCKS;  Surgeon: Vanderbilt Ned, MD;  Location: [facility] OR;  Service: General;  Laterality: Right;    OB History     Gravida  1   Para      Term      Preterm      AB      Living         SAB      IAB      Ectopic      Multiple      Live Births               Home Medications    Prior to Admission medications  Medication Sig Start Date End Date Taking? Authorizing Provider  metFORMIN  (GLUCOPHAGE ) 500 MG tablet Take 2 tablets (1,000 mg total) by mouth 2 (two) times daily with a meal. Take 1 tablet (500 mg) twice a day for 7 days. Increase to 2 tablets (1000 mg) twice a day. 09/21/23   Smith, [place] , CNM  NIFEdipine  (ADALAT  CC) 30 MG 24 hr tablet Take 1 tablet (30 mg total) by mouth daily. Patient taking differently: Take 90 mg by mouth daily. 09/22/23   Smith, Virginia , CNM    Family History No family history on file.  Social History Social History[1]   Allergies    Penicillins   Review of Systems Review of Systems  HENT:  Positive for ear pain.      Physical Exam Triage Vital Signs ED Triage Vitals  Encounter Vitals Group     BP --      Girls Systolic BP Percentile --      Girls Diastolic BP Percentile --      Boys Systolic BP Percentile --      Boys Diastolic BP Percentile --      Pulse --      Resp --      Temp --      Temp src --      SpO2 --      Weight 12/26/24 1314 180 lb (81.6 kg)     Height 12/26/24 1314 5' 8 (1.727 m)     Head Circumference --      Peak Flow --      Pain Score 12/26/24 1315 2     Pain Loc --      Pain Education --      Exclude from Growth Chart --  No data found.  Updated Vital Signs BP (!) 187/106 (BP Location: Left Arm)   Pulse (!) 109   Temp 98.4 F (36.9 C) (Oral)   Resp 18   Ht 5' 8 (1.727 m)   Wt 180 lb (81.6 kg)   LMP 12/03/2024 (Approximate)   SpO2 100%   Breastfeeding No   BMI 27.37 kg/m   Visual Acuity Right Eye Distance:   Left Eye Distance:   Bilateral Distance:    Right Eye Near:   Left Eye Near:    Bilateral Near:     Physical Exam Vitals reviewed.  Constitutional:      General: She is not in acute distress.    Appearance: Normal appearance. She is not ill-appearing.  HENT:     Head: Normocephalic and atraumatic.     Right Ear: Hearing, tympanic membrane, ear canal and external ear normal.     Left Ear: There is impacted cerumen.     Ears:     Comments: The left tympanic membrane is initially fully occluded by cerumen. Following lavage, TM is still not visible, and the canal is erythematous Pulmonary:     Effort: Pulmonary effort is normal.  Neurological:     General: No focal deficit present.     Mental Status: She is alert and oriented to person, place, and time.  Psychiatric:        Mood and Affect: Mood normal.        Behavior: Behavior normal.        Thought Content: Thought content normal.        Judgment: Judgment normal.      UC Treatments /  Results  Labs (all labs ordered are listed, but only abnormal results are displayed) Labs Reviewed - No data to display  EKG   Radiology No results found.  Procedures Procedures (including critical care time)  Medications Ordered in UC Medications - No data to display  Initial Impression / Assessment and Plan / UC Course  I have reviewed the triage vital signs and the nursing notes.  Pertinent labs & imaging results that were available during my care of the patient were reviewed by me and considered in my medical decision making (see chart for details).     Patient is a pleasant 39 y.o. female presenting with impacted cerumen. The patient is afebrile and nontachycardic.  Antipyretic has not been administered today. Following lavage, there is still cerumen impaction. Referred to ENT as below.   Final Clinical Impressions(s) / UC Diagnoses   Final diagnoses:  Impacted cerumen, left ear     Discharge Instructions      - Do not use any drops or over-the-counter medications in your ear for the next 3 days.   -I also recommend keeping your ear dry, use a cottonball or earplug in the shower for 3 days - After 3 days, you can try over-the-counter drops to loosen up the wax. - I sent a referral to ear nose and throat.  They should call you in about 1 week, but if you do not hear from them, call the number on the front of this paperwork to schedule this appointment.      ED Prescriptions   None    PDMP not reviewed this encounter.     [1]  Social History Tobacco Use   Smoking status: Former    Current packs/day: 0.00    Average packs/day: 0.5 packs/day    Types: Cigarettes    Quit date:  03/19/2015    Years since quitting: 9.7   Smokeless tobacco: Never  Substance Use Topics   Alcohol use: No    Comment: occ   Drug use: No     Arlyss Leita BRAVO, PA-C 12/26/24 1359  "
